# Patient Record
Sex: Male | Born: 1940 | Race: White | Hispanic: No | Marital: Married | Smoking: Never smoker
Health system: Southern US, Community
[De-identification: ages and names within clinical notes are randomized; demographics above are authoritative.]

## PROBLEM LIST (undated history)

## (undated) DIAGNOSIS — I10 Essential (primary) hypertension: Secondary | ICD-10-CM

## (undated) DIAGNOSIS — E119 Type 2 diabetes mellitus without complications: Secondary | ICD-10-CM

## (undated) DIAGNOSIS — I639 Cerebral infarction, unspecified: Secondary | ICD-10-CM

## (undated) DIAGNOSIS — J449 Chronic obstructive pulmonary disease, unspecified: Secondary | ICD-10-CM

---

## 2015-09-08 ENCOUNTER — Encounter (HOSPITAL_BASED_OUTPATIENT_CLINIC_OR_DEPARTMENT_OTHER): Payer: Self-pay | Admitting: Emergency Medicine

## 2015-09-08 ENCOUNTER — Emergency Department (HOSPITAL_BASED_OUTPATIENT_CLINIC_OR_DEPARTMENT_OTHER)
Admission: EM | Admit: 2015-09-08 | Discharge: 2015-09-08 | Disposition: A | Discharge status: Home / Self Care | Payer: Medicare Other | Attending: Emergency Medicine | Admitting: Emergency Medicine

## 2015-09-08 DIAGNOSIS — R231 Pallor: Secondary | ICD-10-CM | POA: Insufficient documentation

## 2015-09-08 DIAGNOSIS — N289 Disorder of kidney and ureter, unspecified: Secondary | ICD-10-CM | POA: Insufficient documentation

## 2015-09-08 DIAGNOSIS — J449 Chronic obstructive pulmonary disease, unspecified: Secondary | ICD-10-CM | POA: Diagnosis not present

## 2015-09-08 DIAGNOSIS — Z794 Long term (current) use of insulin: Secondary | ICD-10-CM | POA: Insufficient documentation

## 2015-09-08 DIAGNOSIS — Z7902 Long term (current) use of antithrombotics/antiplatelets: Secondary | ICD-10-CM | POA: Diagnosis not present

## 2015-09-08 DIAGNOSIS — I10 Essential (primary) hypertension: Secondary | ICD-10-CM | POA: Diagnosis not present

## 2015-09-08 DIAGNOSIS — Z7951 Long term (current) use of inhaled steroids: Secondary | ICD-10-CM | POA: Insufficient documentation

## 2015-09-08 DIAGNOSIS — Z8673 Personal history of transient ischemic attack (TIA), and cerebral infarction without residual deficits: Secondary | ICD-10-CM | POA: Diagnosis not present

## 2015-09-08 DIAGNOSIS — Z79899 Other long term (current) drug therapy: Secondary | ICD-10-CM | POA: Insufficient documentation

## 2015-09-08 DIAGNOSIS — E1165 Type 2 diabetes mellitus with hyperglycemia: Secondary | ICD-10-CM | POA: Diagnosis present

## 2015-09-08 HISTORY — DX: Type 2 diabetes mellitus without complications: E11.9

## 2015-09-08 HISTORY — DX: Cerebral infarction, unspecified: I63.9

## 2015-09-08 HISTORY — DX: Chronic obstructive pulmonary disease, unspecified: J44.9

## 2015-09-08 HISTORY — DX: Essential (primary) hypertension: I10

## 2015-09-08 LAB — CBC
HCT: 38.7 % — ABNORMAL LOW (ref 39.0–52.0)
Hemoglobin: 12.5 g/dL — ABNORMAL LOW (ref 13.0–17.0)
MCH: 28.7 pg (ref 26.0–34.0)
MCHC: 32.3 g/dL (ref 30.0–36.0)
MCV: 88.8 fL (ref 78.0–100.0)
PLATELETS: 317 10*3/uL (ref 150–400)
RBC: 4.36 MIL/uL (ref 4.22–5.81)
RDW: 14 % (ref 11.5–15.5)
WBC: 7.4 10*3/uL (ref 4.0–10.5)

## 2015-09-08 LAB — URINALYSIS, ROUTINE W REFLEX MICROSCOPIC
Bilirubin Urine: NEGATIVE
KETONES UR: 40 mg/dL — AB
LEUKOCYTES UA: NEGATIVE
NITRITE: NEGATIVE
PROTEIN: NEGATIVE mg/dL
Specific Gravity, Urine: 1.017 (ref 1.005–1.030)
Urobilinogen, UA: 0.2 mg/dL (ref 0.0–1.0)
pH: 5 (ref 5.0–8.0)

## 2015-09-08 LAB — PROTIME-INR
INR: 0.96 (ref 0.00–1.49)
Prothrombin Time: 13 seconds (ref 11.6–15.2)

## 2015-09-08 LAB — CBG MONITORING, ED
GLUCOSE-CAPILLARY: 139 mg/dL — AB (ref 65–99)
GLUCOSE-CAPILLARY: 352 mg/dL — AB (ref 65–99)
Glucose-Capillary: 522 mg/dL — ABNORMAL HIGH (ref 65–99)
Glucose-Capillary: 332 mg/dL — ABNORMAL HIGH (ref 65–99)
Glucose-Capillary: 215 mg/dL — ABNORMAL HIGH (ref 65–99)
Glucose-Capillary: 172 mg/dL — ABNORMAL HIGH (ref 65–99)

## 2015-09-08 LAB — BASIC METABOLIC PANEL
ANION GAP: 7 (ref 5–15)
Anion gap: 15 (ref 5–15)
BUN: 52 mg/dL — ABNORMAL HIGH (ref 6–20)
BUN: 45 mg/dL — ABNORMAL HIGH (ref 6–20)
CALCIUM: 9.5 mg/dL (ref 8.9–10.3)
CHLORIDE: 100 mmol/L — AB (ref 101–111)
CO2: 20 mmol/L — ABNORMAL LOW (ref 22–32)
CO2: 24 mmol/L (ref 22–32)
CREATININE: 2.63 mg/dL — AB (ref 0.61–1.24)
Calcium: 8.8 mg/dL — ABNORMAL LOW (ref 8.9–10.3)
Chloride: 93 mmol/L — ABNORMAL LOW (ref 101–111)
Creatinine, Ser: 2.08 mg/dL — ABNORMAL HIGH (ref 0.61–1.24)
GFR calc non Af Amer: 22 mL/min — ABNORMAL LOW (ref >60)
GFR calc non Af Amer: 29 mL/min — ABNORMAL LOW (ref >60)
GFR, EST AFRICAN AMERICAN: 26 mL/min — AB (ref >60)
GFR, EST AFRICAN AMERICAN: 34 mL/min — AB (ref >60)
Glucose, Bld: 480 mg/dL — ABNORMAL HIGH (ref 65–99)
Glucose, Bld: 165 mg/dL — ABNORMAL HIGH (ref 65–99)
Potassium: 4 mmol/L (ref 3.5–5.1)
Potassium: 3.7 mmol/L (ref 3.5–5.1)
SODIUM: 128 mmol/L — AB (ref 135–145)
Sodium: 131 mmol/L — ABNORMAL LOW (ref 135–145)

## 2015-09-08 LAB — URINE MICROSCOPIC-ADD ON

## 2015-09-08 MED ORDER — SODIUM CHLORIDE 0.9 % IV SOLN
INTRAVENOUS | Status: DC
  Administered 2015-09-08: 1.6 [IU]/h via INTRAVENOUS

## 2015-09-08 MED ORDER — SODIUM CHLORIDE 0.9 % IV BOLUS (SEPSIS)
500.0000 mL | Freq: Once | INTRAVENOUS | Status: AC
  Administered 2015-09-08: 500 mL via INTRAVENOUS

## 2015-09-08 MED ORDER — DEXTROSE 50 % IV SOLN: 25.0000 mL | INTRAVENOUS | Status: DC | PRN

## 2015-09-08 MED ORDER — DEXTROSE-NACL 5-0.45 % IV SOLN
INTRAVENOUS | Status: DC
  Administered 2015-09-08: 22:00:00 via INTRAVENOUS

## 2015-09-08 MED ORDER — INSULIN REGULAR BOLUS VIA INFUSION
0.0000 [IU] | Freq: Three times a day (TID) | INTRAVENOUS | Status: DC
  Filled 2015-09-08: qty 10

## 2015-09-08 MED ORDER — SODIUM CHLORIDE 0.9 % IV SOLN
INTRAVENOUS | Status: DC
  Administered 2015-09-08: 21:00:00 via INTRAVENOUS

## 2015-09-08 NOTE — ED Notes (Signed)
Pt noted to have a sm skin tear on left hand, pt states hit left hand on car door after arriving to ED, tegaderm applied.

## 2015-09-08 NOTE — ED Notes (Signed)
Pt alert, NAD, calm, interactive, resps e/u, speaking in clear complete sentences, skin W&D, (denies: pain or other sx), reports dry mouth. Attempting urine sample[facility] Given PO water to drink. Labs resulted and reviewed. IV attempted previously x4, unsuccessfully.

## 2015-09-08 NOTE — ED Notes (Signed)
Pt very alert and oriented, follows commands, denies any pain, dizziness, nausea. Appears in no distress

## 2015-09-08 NOTE — ED Notes (Signed)
[facility] and family about d/c plan and results.

## 2015-09-08 NOTE — ED Notes (Signed)
Pt arrives to ED with c/o elevated blood glucose levels

## 2015-09-08 NOTE — ED Notes (Signed)
cbg checked at time of d/c; cbg = 139

## 2015-09-08 NOTE — ED Notes (Signed)
IV attempts x2 unsuccessful.  

## 2015-09-08 NOTE — ED Notes (Signed)
Blood sugar > 600 for 2 days, states he bolused himself with additional insulin, he has a pump

## 2015-09-08 NOTE — ED Provider Notes (Signed)
CSN: [id number]     Arrival date & time 09/08/15  1828 History  By signing my name below, I, [name], attest that this documentation has been prepared under the direction and in the presence of Arby BarretteMarcy Zhoe Catania, MD. Electronically Signed: [name], ED Scribe. 09/08/2015. 11:32 PM.      Chief Complaint  Patient presents with  . Hyperglycemia    Patient is a 74 y.o. male presenting with hyperglycemia. The history is provided by the patient. No language interpreter was used.  Hyperglycemia  HPI Comments: Francisco Meyer is a 74 y.o. male who presents to the Emergency Department complaining of hyperglycemia onset 1 day prior. Pt states that he has only felt intermittently nauseous since yesterday. Pt states that his highest blood sugar reading was in the 600 range today. Pt states that he gave himself 5 units of insulin and the blood sugar stayed in the 600 range. Pt also reports cramping in his bilateral hands yesterday that resolved after running warm water over his hands. Pt has a an insulin pump in his right abdomen. He states that he is concerned that his insulin pump may be malfunctioning. Pt denies abdominal pain, vomiting, CP or SOB   Past Medical History  Diagnosis Date  . Diabetes mellitus without complication (HCC)   . Stroke (HCC)   . Hypertension   . COPD (chronic obstructive pulmonary disease) (HCC)    History reviewed. No pertinent past surgical history. History reviewed. No pertinent family history. Social History  Substance Use Topics  . Smoking status: Never Smoker   . Smokeless tobacco: None  . Alcohol Use: No    Review of Systems 10 Systems reviewed and all are negative for acute change except as noted in the HPI.    Allergies  Review of patient's allergies indicates no known allergies.  Home Medications   Prior to Admission medications   Medication Sig Start Date End Date Taking? Authorizing Provider  calcium-vitamin D (OSCAL WITH D) 500-200  MG-UNIT tablet Take 1 tablet by mouth.   Yes Historical Provider, MD  carvedilol (COREG CR) 80 MG 24 hr capsule Take 125 mg by mouth daily.   Yes Historical Provider, MD  clopidogrel (PLAVIX) 75 MG tablet Take 75 mg by mouth daily.   Yes Historical Provider, MD  ferrous fumarate (HEMOCYTE - 106 MG FE) 325 (106 FE) MG TABS tablet Take 1 tablet by mouth.   Yes Historical Provider, MD  fexofenadine (ALLEGRA) 30 MG tablet Take 30 mg by mouth 2 (two) times daily.   Yes Historical Provider, MD  Fluticasone-Salmeterol (ADVAIR) 250-50 MCG/DOSE AEPB Inhale 1 puff into the lungs 2 (two) times daily.   Yes Historical Provider, MD  hydrocortisone (CORTEF) 5 MG tablet Take 5 mg by mouth daily.   Yes Historical Provider, MD  insulin aspart (NOVOLOG) 100 UNIT/ML injection Inject into the skin 3 (three) times daily before meals.   Yes Historical Provider, MD  levothyroxine (SYNTHROID, LEVOTHROID) 25 MCG tablet Take 25 mcg by mouth daily before breakfast.   Yes Historical Provider, MD  omeprazole (PRILOSEC) 20 MG capsule Take 20 mg by mouth daily.   Yes Historical Provider, MD  potassium citrate (UROCIT-K) 10 MEQ (1080 MG) SR tablet Take 10 mEq by mouth 3 (three) times daily with meals.   Yes Historical Provider, MD  sertraline (ZOLOFT) 50 MG tablet Take 50 mg by mouth daily.   Yes Historical Provider, MD  simvastatin (ZOCOR) 5 MG tablet Take 5 mg by mouth daily.  Yes Historical Provider, MD  tiotropium (SPIRIVA) 18 MCG inhalation capsule Place 18 mcg into inhaler and inhale daily.   Yes Historical Provider, MD   BP 116/56 mmHg  Pulse 69  Temp(Src) 98.1 F (36.7 C) (Oral)  Resp 20  Ht  (1.778 m)  Wt 160 lb (72.576 kg)  BMI 22.96 kg/m2  SpO2 98%   Physical Exam  Constitutional: He is oriented to person, place, and time. He appears well-developed and well-nourished.  HENT:  Head: Normocephalic and atraumatic.  Mouth/Throat: Oropharynx is clear and moist.  Eyes: EOM are normal. Pupils are equal,  round, and reactive to light.  Neck: Neck supple.  Cardiovascular: Normal rate, regular rhythm, normal heart sounds and intact distal pulses.   Pulmonary/Chest: Effort normal and breath sounds normal.  Abdominal: Soft. Bowel sounds are normal. He exhibits no distension. There is no tenderness.  Musculoskeletal: Normal range of motion. He exhibits no edema or tenderness.  Feet are good condition without any wounds or cellulitis. No peripheral edema.  Neurological: He is alert and oriented to person, place, and time. He has normal strength. Coordination normal. GCS eye subscore is 4. GCS verbal subscore is 5. GCS motor subscore is 6.  Skin: Skin is warm, dry and intact. There is pallor.  Psychiatric: He has a normal mood and affect.    ED Course  Procedures (including critical care time) DIAGNOSTIC STUDIES: Oxygen Saturation is 96% on RA, normal by my interpretation.    COORDINATION OF CARE: 8:20 PM-Discussed treatment plan with pt at bedside and pt agreed to plan.     Labs Review Labs Reviewed  BASIC METABOLIC PANEL - Abnormal; Notable for the following:    Sodium 128 (*)    Chloride 93 (*)    CO2 20 (*)    Glucose, Bld 480 (*)    BUN 52 (*)    Creatinine, Ser 2.63 (*)    GFR calc non Af Amer 22 (*)    GFR calc Af Amer 26 (*)    All other components within normal limits  CBC - Abnormal; Notable for the following:    Hemoglobin 12.5 (*)    HCT 38.7 (*)    All other components within normal limits  URINALYSIS, ROUTINE W REFLEX MICROSCOPIC (NOT AT [facility]) - Abnormal; Notable for the following:    Glucose, UA >1000 (*)    Hgb urine dipstick TRACE (*)    Ketones, ur 40 (*)    All other components within normal limits  BASIC METABOLIC PANEL - Abnormal; Notable for the following:    Sodium 131 (*)    Chloride 100 (*)    Glucose, Bld 165 (*)    BUN 45 (*)    Creatinine, Ser 2.08 (*)    Calcium 8.8 (*)    GFR calc non Af Amer 29 (*)    GFR calc Af Amer 34 (*)    All other  components within normal limits  CBG MONITORING, ED - Abnormal; Notable for the following:    Glucose-Capillary 522 (*)    All other components within normal limits  CBG MONITORING, ED - Abnormal; Notable for the following:    Glucose-Capillary 352 (*)    All other components within normal limits  CBG MONITORING, ED - Abnormal; Notable for the following:    Glucose-Capillary 332 (*)    All other components within normal limits  CBG MONITORING, ED - Abnormal; Notable for the following:    Glucose-Capillary 215 (*)  All other components within normal limits  CBG MONITORING, ED - Abnormal; Notable for the following:    Glucose-Capillary 172 (*)    All other components within normal limits  PROTIME-INR  URINE MICROSCOPIC-ADD ON    Imaging Review No results found.    EKG Interpretation None      MDM   Final diagnoses:  Type 2 diabetes mellitus with hyperglycemia, with long-term current use of insulin (HCC)  Renal insufficiency   It appears that the patient's insulin pump had stopped delivering insulin midday. He reported feeling fatigued and malaise. He did not have fever or other localizing indication of infection. The patient had mild renal insufficiency in addition to hyperglycemia. He was hydrated and started on insulin drip. With hydration and treatment with insulin drip, his blood sugar normalized to 139. Blood pressures and heart rate remain stable. At this time the patient will be discharged to return home and will reload his insulin pump and resumed its use. He is advised if he has problems controlling his blood sugar was still pump is delivering insulin again or if he develops other medical problems he is to return to the emergency department. He is advised to call on the following working day to an endocrinologist to establish follow-up in this area as he has just moved in the past 2 days.       Arby Barrette, MD 09/12/15 [phone]

## 2015-09-08 NOTE — Discharge Instructions (Signed)

## 2015-09-08 NOTE — ED Notes (Signed)
IV access unsuccessful  °

## 2015-09-08 NOTE — ED Notes (Signed)
Reviewed Insulin Pump, insertion site at RLQ of abdomen Basal Hx: 1200A - .775u/hr                  0700A - .975u/hr                  0800A - 1.00u/hr

## 2015-09-08 NOTE — ED Notes (Signed)
Blood work obtained immediately and to lab, orders per protocol

## 2015-09-08 NOTE — ED Notes (Signed)
Also reviewing insulin pump: reading RESIDUAL LOW - pt states his device is basically empty

## 2015-09-08 NOTE — ED Notes (Signed)
[facility]

## 2018-02-19 ENCOUNTER — Inpatient Hospital Stay (HOSPITAL_COMMUNITY): Payer: Medicare Other

## 2018-02-19 ENCOUNTER — Inpatient Hospital Stay (HOSPITAL_COMMUNITY)
Admission: EM | Admit: 2018-02-19 | Discharge: 2018-03-02 | DRG: 091 | Disposition: E | Payer: Medicare Other | Attending: Pulmonary Disease | Admitting: Pulmonary Disease

## 2018-02-19 ENCOUNTER — Emergency Department (HOSPITAL_COMMUNITY): Payer: Medicare Other

## 2018-02-19 DIAGNOSIS — E232 Diabetes insipidus: Secondary | ICD-10-CM | POA: Diagnosis present

## 2018-02-19 DIAGNOSIS — R579 Shock, unspecified: Secondary | ICD-10-CM | POA: Diagnosis present

## 2018-02-19 DIAGNOSIS — Z978 Presence of other specified devices: Secondary | ICD-10-CM | POA: Diagnosis not present

## 2018-02-19 DIAGNOSIS — N289 Disorder of kidney and ureter, unspecified: Secondary | ICD-10-CM | POA: Diagnosis present

## 2018-02-19 DIAGNOSIS — E1129 Type 2 diabetes mellitus with other diabetic kidney complication: Secondary | ICD-10-CM | POA: Diagnosis present

## 2018-02-19 DIAGNOSIS — E872 Acidosis, unspecified: Secondary | ICD-10-CM | POA: Diagnosis present

## 2018-02-19 DIAGNOSIS — J9601 Acute respiratory failure with hypoxia: Secondary | ICD-10-CM | POA: Diagnosis present

## 2018-02-19 DIAGNOSIS — Z515 Encounter for palliative care: Secondary | ICD-10-CM | POA: Diagnosis present

## 2018-02-19 DIAGNOSIS — G9389 Other specified disorders of brain: Secondary | ICD-10-CM | POA: Diagnosis present

## 2018-02-19 DIAGNOSIS — G931 Anoxic brain damage, not elsewhere classified: Principal | ICD-10-CM | POA: Diagnosis present

## 2018-02-19 DIAGNOSIS — Z794 Long term (current) use of insulin: Secondary | ICD-10-CM | POA: Diagnosis not present

## 2018-02-19 DIAGNOSIS — Z8673 Personal history of transient ischemic attack (TIA), and cerebral infarction without residual deficits: Secondary | ICD-10-CM

## 2018-02-19 DIAGNOSIS — E230 Hypopituitarism: Secondary | ICD-10-CM | POA: Diagnosis present

## 2018-02-19 DIAGNOSIS — N390 Urinary tract infection, site not specified: Secondary | ICD-10-CM | POA: Diagnosis present

## 2018-02-19 DIAGNOSIS — R809 Proteinuria, unspecified: Secondary | ICD-10-CM | POA: Diagnosis not present

## 2018-02-19 DIAGNOSIS — J9600 Acute respiratory failure, unspecified whether with hypoxia or hypercapnia: Secondary | ICD-10-CM | POA: Diagnosis not present

## 2018-02-19 DIAGNOSIS — I10 Essential (primary) hypertension: Secondary | ICD-10-CM | POA: Diagnosis present

## 2018-02-19 DIAGNOSIS — I69154 Hemiplegia and hemiparesis following nontraumatic intracerebral hemorrhage affecting left non-dominant side: Secondary | ICD-10-CM

## 2018-02-19 DIAGNOSIS — D649 Anemia, unspecified: Secondary | ICD-10-CM | POA: Diagnosis present

## 2018-02-19 DIAGNOSIS — J449 Chronic obstructive pulmonary disease, unspecified: Secondary | ICD-10-CM | POA: Diagnosis present

## 2018-02-19 DIAGNOSIS — G40901 Epilepsy, unspecified, not intractable, with status epilepticus: Secondary | ICD-10-CM | POA: Diagnosis present

## 2018-02-19 DIAGNOSIS — E039 Hypothyroidism, unspecified: Secondary | ICD-10-CM | POA: Diagnosis present

## 2018-02-19 DIAGNOSIS — J690 Pneumonitis due to inhalation of food and vomit: Secondary | ICD-10-CM | POA: Diagnosis present

## 2018-02-19 DIAGNOSIS — R296 Repeated falls: Secondary | ICD-10-CM | POA: Diagnosis present

## 2018-02-19 DIAGNOSIS — Z66 Do not resuscitate: Secondary | ICD-10-CM | POA: Diagnosis present

## 2018-02-19 DIAGNOSIS — I469 Cardiac arrest, cause unspecified: Secondary | ICD-10-CM | POA: Diagnosis present

## 2018-02-19 DIAGNOSIS — Z79890 Hormone replacement therapy (postmenopausal): Secondary | ICD-10-CM | POA: Diagnosis not present

## 2018-02-19 DIAGNOSIS — I959 Hypotension, unspecified: Secondary | ICD-10-CM

## 2018-02-19 DIAGNOSIS — J984 Other disorders of lung: Secondary | ICD-10-CM

## 2018-02-19 DIAGNOSIS — R8281 Pyuria: Secondary | ICD-10-CM | POA: Diagnosis present

## 2018-02-19 DIAGNOSIS — Z4659 Encounter for fitting and adjustment of other gastrointestinal appliance and device: Secondary | ICD-10-CM

## 2018-02-19 LAB — BLOOD GAS, ARTERIAL
ACID-BASE DEFICIT: 8.8 mmol/L — AB (ref 0.0–2.0)
ACID-BASE DEFICIT: 5.1 mmol/L — AB (ref 0.0–2.0)
Acid-base deficit: 4.1 mmol/L — ABNORMAL HIGH (ref 0.0–2.0)
BICARBONATE: 17 mmol/L — AB (ref 20.0–28.0)
Bicarbonate: 23.7 mmol/L (ref 20.0–28.0)
Bicarbonate: 20.4 mmol/L (ref 20.0–28.0)
DRAWN BY: 41875
Drawn by: 398991
Drawn by: 24686
FIO2: 100
FIO2: 60
FIO2: 60
MECHVT: 590 mL
MECHVT: 590 mL
O2 SAT: 97.3 %
O2 Saturation: 98.6 %
O2 Saturation: 97.8 %
PATIENT TEMPERATURE: 98.6
PATIENT TEMPERATURE: 98.6
PCO2 ART: 39.1 mmHg (ref 32.0–48.0)
PEEP/CPAP: 5 cmH2O
PEEP/CPAP: 5 cmH2O
PH ART: 7.26 — AB (ref 7.350–7.450)
PO2 ART: 391 mmHg — AB (ref 83.0–108.0)
PO2 ART: 126 mmHg — AB (ref 83.0–108.0)
PO2 ART: 134 mmHg — AB (ref 83.0–108.0)
Patient temperature: 98.6
RATE: 20 resp/min
RATE: 20 resp/min
pCO2 arterial: 70.9 mmHg (ref 32.0–48.0)
pCO2 arterial: 44.7 mmHg (ref 32.0–48.0)
pH, Arterial: 7.15 — CL (ref 7.350–7.450)
pH, Arterial: 7.283 — ABNORMAL LOW (ref 7.350–7.450)

## 2018-02-19 LAB — CBC WITH DIFFERENTIAL/PLATELET
BASOS ABS: 0 10*3/uL (ref 0.0–0.1)
Basophils Relative: 0 %
Eosinophils Absolute: 0.1 10*3/uL (ref 0.0–0.7)
Eosinophils Relative: 1 %
HCT: 31 % — ABNORMAL LOW (ref 39.0–52.0)
Hemoglobin: 9.7 g/dL — ABNORMAL LOW (ref 13.0–17.0)
LYMPHS ABS: 1.5 10*3/uL (ref 0.7–4.0)
LYMPHS PCT: 18 %
MCH: 29 pg (ref 26.0–34.0)
MCHC: 31.3 g/dL (ref 30.0–36.0)
MCV: 92.8 fL (ref 78.0–100.0)
MONO ABS: 0.4 10*3/uL (ref 0.1–1.0)
Monocytes Relative: 5 %
NEUTROS ABS: 6.4 10*3/uL (ref 1.7–7.7)
Neutrophils Relative %: 76 %
Platelets: 284 10*3/uL (ref 150–400)
RBC: 3.34 MIL/uL — AB (ref 4.22–5.81)
RDW: 15.4 % (ref 11.5–15.5)
WBC: 8.4 10*3/uL (ref 4.0–10.5)

## 2018-02-19 LAB — URINALYSIS, ROUTINE W REFLEX MICROSCOPIC
BILIRUBIN URINE: NEGATIVE
GLUCOSE, UA: 150 mg/dL — AB
KETONES UR: NEGATIVE mg/dL
NITRITE: NEGATIVE
PH: 5 (ref 5.0–8.0)
Protein, ur: 30 mg/dL — AB
Specific Gravity, Urine: 1.009 (ref 1.005–1.030)

## 2018-02-19 LAB — I-STAT CG4 LACTIC ACID, ED: Lactic Acid, Venous: 6.79 mmol/L (ref 0.5–1.9)

## 2018-02-19 LAB — CBG MONITORING, ED
Glucose-Capillary: 222 mg/dL — ABNORMAL HIGH (ref 65–99)
Glucose-Capillary: 246 mg/dL — ABNORMAL HIGH (ref 65–99)

## 2018-02-19 LAB — GLUCOSE, CAPILLARY
GLUCOSE-CAPILLARY: 84 mg/dL (ref 65–99)
GLUCOSE-CAPILLARY: 76 mg/dL (ref 65–99)
Glucose-Capillary: 230 mg/dL — ABNORMAL HIGH (ref 65–99)

## 2018-02-19 LAB — COMPREHENSIVE METABOLIC PANEL
ALT: 54 U/L (ref 17–63)
AST: 97 U/L — AB (ref 15–41)
Albumin: 2.8 g/dL — ABNORMAL LOW (ref 3.5–5.0)
Alkaline Phosphatase: 88 U/L (ref 38–126)
Anion gap: 18 — ABNORMAL HIGH (ref 5–15)
BUN: 28 mg/dL — AB (ref 6–20)
CO2: 15 mmol/L — ABNORMAL LOW (ref 22–32)
CREATININE: 2.07 mg/dL — AB (ref 0.61–1.24)
Calcium: 8.4 mg/dL — ABNORMAL LOW (ref 8.9–10.3)
Chloride: 102 mmol/L (ref 101–111)
GFR, EST AFRICAN AMERICAN: 34 mL/min — AB (ref >60)
GFR, EST NON AFRICAN AMERICAN: 29 mL/min — AB (ref >60)
Glucose, Bld: 278 mg/dL — ABNORMAL HIGH (ref 65–99)
Potassium: 4.6 mmol/L (ref 3.5–5.1)
Sodium: 135 mmol/L (ref 135–145)
Total Bilirubin: 0.7 mg/dL (ref 0.3–1.2)
Total Protein: 6 g/dL — ABNORMAL LOW (ref 6.5–8.1)

## 2018-02-19 LAB — CBC
HCT: 32.2 % — ABNORMAL LOW (ref 39.0–52.0)
Hemoglobin: 10.2 g/dL — ABNORMAL LOW (ref 13.0–17.0)
MCH: 29 pg (ref 26.0–34.0)
MCHC: 31.7 g/dL (ref 30.0–36.0)
MCV: 91.5 fL (ref 78.0–100.0)
Platelets: 315 10*3/uL (ref 150–400)
RBC: 3.52 MIL/uL — ABNORMAL LOW (ref 4.22–5.81)
RDW: 15.3 % (ref 11.5–15.5)
WBC: 18 10*3/uL — ABNORMAL HIGH (ref 4.0–10.5)

## 2018-02-19 LAB — I-STAT ARTERIAL BLOOD GAS, ED
Acid-base deficit: 5 mmol/L — ABNORMAL HIGH (ref 0.0–2.0)
BICARBONATE: 22.5 mmol/L (ref 20.0–28.0)
O2 SAT: 100 %
PCO2 ART: 44.6 mmHg (ref 32.0–48.0)
PO2 ART: 453 mmHg — AB (ref 83.0–108.0)
Patient temperature: 34.4
TCO2: 24 mmol/L (ref 22–32)
pH, Arterial: 7.297 — ABNORMAL LOW (ref 7.350–7.450)

## 2018-02-19 LAB — LACTIC ACID, PLASMA
Lactic Acid, Venous: 2 mmol/L (ref 0.5–1.9)
Lactic Acid, Venous: 5.5 mmol/L (ref 0.5–1.9)

## 2018-02-19 LAB — BRAIN NATRIURETIC PEPTIDE: B NATRIURETIC PEPTIDE 5: 125.5 pg/mL — AB (ref 0.0–100.0)

## 2018-02-19 LAB — CREATININE, SERUM
Creatinine, Ser: 1.81 mg/dL — ABNORMAL HIGH (ref 0.61–1.24)
GFR calc Af Amer: 40 mL/min — ABNORMAL LOW (ref >60)
GFR, EST NON AFRICAN AMERICAN: 34 mL/min — AB (ref >60)

## 2018-02-19 LAB — HEMOGLOBIN A1C
Hgb A1c MFr Bld: 9.7 % — ABNORMAL HIGH (ref 4.8–5.6)
MEAN PLASMA GLUCOSE: 231.69 mg/dL

## 2018-02-19 LAB — CORTISOL: Cortisol, Plasma: 4 ug/dL

## 2018-02-19 LAB — I-STAT TROPONIN, ED: TROPONIN I, POC: 0.02 ng/mL (ref 0.00–0.08)

## 2018-02-19 LAB — MRSA PCR SCREENING: MRSA BY PCR: NEGATIVE

## 2018-02-19 MED ORDER — SODIUM BICARBONATE 8.4 % IV SOLN
50.0000 meq | Freq: Once | INTRAVENOUS | Status: AC
  Administered 2018-02-19: 50 meq via INTRAVENOUS
  Filled 2018-02-19: qty 50

## 2018-02-19 MED ORDER — STERILE WATER FOR INJECTION IV SOLN
INTRAVENOUS | Status: DC
  Administered 2018-02-19 – 2018-02-20 (×3): via INTRAVENOUS
  Filled 2018-02-19 (×4): qty 850

## 2018-02-19 MED ORDER — HEPARIN SODIUM (PORCINE) 5000 UNIT/ML IJ SOLN
5000.0000 [IU] | Freq: Three times a day (TID) | INTRAMUSCULAR | Status: DC
  Administered 2018-02-19 – 2018-02-21 (×6): 5000 [IU] via SUBCUTANEOUS
  Filled 2018-02-19 (×6): qty 1

## 2018-02-19 MED ORDER — LORAZEPAM 2 MG/ML IJ SOLN
INTRAMUSCULAR | Status: AC
  Administered 2018-02-19: 2 mg
  Filled 2018-02-19: qty 1

## 2018-02-19 MED ORDER — HYDROCORTISONE NA SUCCINATE PF 100 MG IJ SOLR
100.0000 mg | Freq: Three times a day (TID) | INTRAMUSCULAR | Status: DC
  Administered 2018-02-19 – 2018-02-21 (×7): 100 mg via INTRAVENOUS
  Filled 2018-02-19 (×7): qty 2

## 2018-02-19 MED ORDER — PANTOPRAZOLE SODIUM 40 MG PO PACK
40.0000 mg | PACK | Freq: Every day | ORAL | Status: DC
  Administered 2018-02-20 – 2018-02-21 (×2): 40 mg
  Filled 2018-02-19 (×3): qty 20

## 2018-02-19 MED ORDER — ALBUTEROL SULFATE (2.5 MG/3ML) 0.083% IN NEBU
INHALATION_SOLUTION | RESPIRATORY_TRACT | Status: AC
  Filled 2018-02-19: qty 3

## 2018-02-19 MED ORDER — VANCOMYCIN HCL 10 G IV SOLR
1500.0000 mg | Freq: Once | INTRAVENOUS | Status: AC
  Administered 2018-02-19: 1500 mg via INTRAVENOUS
  Filled 2018-02-19: qty 1500

## 2018-02-19 MED ORDER — ALBUTEROL SULFATE (2.5 MG/3ML) 0.083% IN NEBU
2.5000 mg | INHALATION_SOLUTION | RESPIRATORY_TRACT | Status: DC
  Administered 2018-02-19 – 2018-02-20 (×5): 2.5 mg via RESPIRATORY_TRACT
  Filled 2018-02-19 (×4): qty 3

## 2018-02-19 MED ORDER — BUDESONIDE 0.25 MG/2ML IN SUSP
0.2500 mg | Freq: Two times a day (BID) | RESPIRATORY_TRACT | Status: DC
  Administered 2018-02-19 – 2018-02-21 (×4): 0.25 mg via RESPIRATORY_TRACT
  Filled 2018-02-19 (×4): qty 2

## 2018-02-19 MED ORDER — ALBUTEROL SULFATE (2.5 MG/3ML) 0.083% IN NEBU
2.5000 mg | INHALATION_SOLUTION | RESPIRATORY_TRACT | Status: DC | PRN
Start: 2018-02-19 — End: 2018-02-22
  Administered 2018-02-19: 2.5 mg via RESPIRATORY_TRACT

## 2018-02-19 MED ORDER — ORAL CARE MOUTH RINSE
15.0000 mL | Freq: Two times a day (BID) | OROMUCOSAL | Status: DC
Start: 2018-02-20 — End: 2018-02-19

## 2018-02-19 MED ORDER — CHLORHEXIDINE GLUCONATE 0.12 % MT SOLN: 15.0000 mL | Freq: Two times a day (BID) | OROMUCOSAL | Status: DC

## 2018-02-19 MED ORDER — LORAZEPAM 2 MG/ML IJ SOLN
1.0000 mg | INTRAMUSCULAR | Status: DC | PRN
  Administered 2018-02-19: 1 mg via INTRAVENOUS
  Filled 2018-02-19: qty 1

## 2018-02-19 MED ORDER — SODIUM CHLORIDE 0.9 % IV SOLN: 250.0000 mL | INTRAVENOUS | Status: DC | PRN

## 2018-02-19 MED ORDER — SODIUM CHLORIDE 0.9 % IV SOLN
INTRAVENOUS | Status: DC
  Administered 2018-02-19 (×2): via INTRAVENOUS

## 2018-02-19 MED ORDER — SODIUM CHLORIDE 0.9 % IV SOLN: INTRAVENOUS | Status: DC | PRN

## 2018-02-19 MED ORDER — NOREPINEPHRINE BITARTRATE 1 MG/ML IV SOLN
0.0000 ug/min | INTRAVENOUS | Status: DC
  Administered 2018-02-19: 6 ug/min via INTRAVENOUS
  Administered 2018-02-20: 8 ug/min via INTRAVENOUS
  Administered 2018-02-21: 2 ug/min via INTRAVENOUS
  Filled 2018-02-19 (×3): qty 4

## 2018-02-19 MED ORDER — MIDAZOLAM HCL 2 MG/2ML IJ SOLN
2.0000 mg | INTRAMUSCULAR | Status: DC | PRN
  Administered 2018-02-19 – 2018-02-20 (×3): 2 mg via INTRAVENOUS
  Filled 2018-02-19: qty 2

## 2018-02-19 MED ORDER — ALBUTEROL SULFATE (2.5 MG/3ML) 0.083% IN NEBU
2.5000 mg | INHALATION_SOLUTION | Freq: Four times a day (QID) | RESPIRATORY_TRACT | Status: DC
  Administered 2018-02-19: 2.5 mg via RESPIRATORY_TRACT
  Filled 2018-02-19: qty 3

## 2018-02-19 MED ORDER — INSULIN ASPART 100 UNIT/ML ~~LOC~~ SOLN
0.0000 [IU] | SUBCUTANEOUS | Status: DC
  Administered 2018-02-19 (×2): 7 [IU] via SUBCUTANEOUS
  Administered 2018-02-20: 15 [IU] via SUBCUTANEOUS
  Administered 2018-02-20: 7 [IU] via SUBCUTANEOUS
  Administered 2018-02-20: 15 [IU] via SUBCUTANEOUS
  Administered 2018-02-20: 11 [IU] via SUBCUTANEOUS
  Administered 2018-02-21: 4 [IU] via SUBCUTANEOUS
  Administered 2018-02-21: 11 [IU] via SUBCUTANEOUS
  Filled 2018-02-19: qty 1

## 2018-02-19 MED ORDER — VANCOMYCIN HCL IN DEXTROSE 1-5 GM/200ML-% IV SOLN
1000.0000 mg | INTRAVENOUS | Status: DC
  Administered 2018-02-20 – 2018-02-21 (×2): 1000 mg via INTRAVENOUS
  Filled 2018-02-19 (×2): qty 200

## 2018-02-19 MED ORDER — SODIUM CHLORIDE 0.9 % IV SOLN
0.5000 mg/h | INTRAVENOUS | Status: DC
  Administered 2018-02-19: 2 mg/h via INTRAVENOUS
  Filled 2018-02-19 (×2): qty 10
  Filled 2018-02-19: qty 20

## 2018-02-19 MED ORDER — SODIUM CHLORIDE 0.9 % IV BOLUS
2000.0000 mL | Freq: Once | INTRAVENOUS | Status: AC
  Administered 2018-02-19: 2000 mL via INTRAVENOUS

## 2018-02-19 MED ORDER — ORAL CARE MOUTH RINSE
15.0000 mL | OROMUCOSAL | Status: DC
  Administered 2018-02-19 – 2018-02-21 (×17): 15 mL via OROMUCOSAL

## 2018-02-19 MED ORDER — NOREPINEPHRINE BITARTRATE 1 MG/ML IV SOLN
0.0000 ug/min | Freq: Once | INTRAVENOUS | Status: AC
  Administered 2018-02-19: 10 ug/min via INTRAVENOUS
  Filled 2018-02-19: qty 4

## 2018-02-19 MED ORDER — PIPERACILLIN-TAZOBACTAM 3.375 G IVPB 30 MIN
3.3750 g | Freq: Once | INTRAVENOUS | Status: AC
Start: 2018-02-19 — End: 2018-02-19
  Administered 2018-02-19: 3.375 g via INTRAVENOUS
  Filled 2018-02-19: qty 50

## 2018-02-19 MED ORDER — CHLORHEXIDINE GLUCONATE 0.12% ORAL RINSE (MEDLINE KIT)
15.0000 mL | Freq: Two times a day (BID) | OROMUCOSAL | Status: DC
  Administered 2018-02-19 – 2018-02-21 (×4): 15 mL via OROMUCOSAL

## 2018-02-19 MED FILL — Medication: Qty: 1 | Status: AC

## 2018-02-19 NOTE — ED Provider Notes (Signed)
[facility] Note   CSN: [id number] Arrival date & time: 2018/09/20  [id number]     History   Chief Complaint Chief Complaint  Patient presents with  . Trauma   Level 5 caveat: Unresponsive/intubated  HPI Francisco Meyer is a 77 y.o. male.  HPI Patient is a 77 year old male who is brought to the emergency department post CPR.  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  He has a history of COPD and diabetes.  Downtime is known to be at least 15 minutes.  Was in his normal state of health today.  Had been assisted to the restroom to use the commode.  When staff went to retrieve him from the commode they initially had difficulty entering and found him laying on the floor unresponsive.  Initial rhythm for EMS was asystole.  Patient was intubated on scene and standard ACLS was initiated.  He received epinephrine x2 and return to normal sinus rhythm.  He has had hypotension in route.  He has been given 500 cc of fluids.    Past medical history: Hemorrhagic stroke Left-sided hemiparesis Hypothyroidism Diabetes COPD          Home Medications    Unavailable  Family History Unknown  Social History Unable to obtain   Allergies   Patient has no known allergies.   Review of Systems Review of Systems  Unable to perform ROS: Intubated     Physical Exam Updated Vital Signs BP (!) 77/59   Pulse 79   Temp (!) 96 F (35.6 C) (Rectal)   Resp 18   SpO2 97%   Physical Exam   ED Treatments / Results  Labs (all labs ordered are listed, but only abnormal results are displayed) Labs Reviewed  CBC WITH DIFFERENTIAL/PLATELET - Abnormal; Notable for the following components:      Result Value   RBC 3.34 (*)    Hemoglobin 9.7 (*)    HCT 31.0 (*)    All other components within normal limits  I-STAT CG4 LACTIC ACID, ED - Abnormal; Notable for the  following components:   Lactic Acid, Venous 6.79 (*)    All other components within normal limits  CBG MONITORING, ED - Abnormal; Notable for the following components:   Glucose-Capillary 222 (*)    All other components within normal limits  CULTURE, BLOOD (ROUTINE X 2)  CULTURE, BLOOD (ROUTINE X 2)  URINE CULTURE  COMPREHENSIVE METABOLIC PANEL  URINALYSIS, ROUTINE W REFLEX MICROSCOPIC  I-STAT TROPONIN, ED  I-STAT ARTERIAL BLOOD GAS, ED    EKG EKG Interpretation  Date/Time:  Saturday February 19 2018 06:47:35 EDT Ventricular Rate:  87 PR Interval:    QRS Duration: 142 QT Interval:  435 QTC Calculation: 524 R Axis:   -74 Text Interpretation:  Sinus rhythm RBBB and LAFB No old tracing to compare Confirmed by Azalia Bilisampos, Dail Lerew ([id number]) on 06-22-2018 7:16:38 AM   Radiology Dg Chest Portable 1 View  Result Date: 06-22-2018 CLINICAL DATA:  Status post cardiac arrest and CPR.  Intubated. EXAM: PORTABLE CHEST 1 VIEW COMPARISON:  None. FINDINGS: Endotracheal tube tip in the right mainstem bronchus. Possible nasogastric tube extending into the stomach. Normal sized heart. Dense aortic arch calcifications. The interstitial markings are mildly prominent there is some linear density in the right upper lobe and left lung base. Diffuse osteopenia. IMPRESSION: 1. Mild left basilar atelectasis and possible early changes of aspiration pneumonitis.  2. Linear scarring in the right upper lobe. 3. Mild chronic interstitial lung disease. Electronically Signed   By: Beckie Salts M.D.   On: 02/22/2018 07:30    Procedures .Critical Care Performed by: Azalia Bilis, MD Authorized by: Azalia Bilis, MD     CRITICAL CARE Performed by: Azalia Bilis Total critical care time: 40 minutes Critical care time was exclusive of separately billable procedures and treating other patients. Critical care was necessary to treat or prevent imminent or life-threatening deterioration. Critical care was time spent personally  by me on the following activities: development of treatment plan with patient and/or surrogate as well as nursing, discussions with consultants, evaluation of patient's response to treatment, examination of patient, obtaining history from patient or surrogate, ordering and performing treatments and interventions, ordering and review of laboratory studies, ordering and review of radiographic studies, pulse oximetry and re-evaluation of patient's condition.   Medications Ordered in ED Medications  0.9 %  sodium chloride infusion (has no administration in time range)  sodium chloride 0.9 % bolus 2,000 mL (0 mLs Intravenous Stopped 02/06/2018 0725)  norepinephrine (LEVOPHED) 4 mg in dextrose 5 % 250 mL (0.016 mg/mL) infusion (10 mcg/min Intravenous New Bag/Given 02/22/2018 0709)     Initial Impression / Assessment and Plan / ED Course  I have reviewed the triage vital signs and the nursing notes.  Pertinent labs & imaging results that were available during my care of the patient were reviewed by me and considered in my medical decision making (see chart for details).    Initial rhythm for EMS was asystole.  Patient in normal sinus rhythm on arrival to the emergency department.  No clear etiology for cause as he had otherwise been his normal state of health.  Son reports frequent falls.  He was in an outside hospital yesterday for a fall and had a CT scan of his head.  He states the falls are not uncommon for the patient given his history of hemorrhagic stroke.  2 L of IV fluids given and persistent hypotension remains.  Additional 2 L of fluid will be given now and patient will be started on levo fed.  Elevated lactate may be just from downtime.  Will cover for infection in the meantime with vancomycin and Zosyn.  Blood and urine cultures.  Temp Foley.  Respiratory to place left-sided arterial line.  Records from outside hospital reviewed including recent ER visit with CT imaging of the head.  Hopefully  will be able to wean the patient's levophed  Consultations: Pulmonary critical care  Disposition: Intensive care unit   Final Clinical Impressions(s) / ED Diagnoses   Final diagnoses:  Cardiac arrest (HCC)  Hypotension, unspecified hypotension type    ED Discharge Orders    None       Azalia Bilis, MD 02/20/18 [phone]

## 2018-02-19 NOTE — Progress Notes (Signed)
Pt transported on vent from the ED. Upon arrival to the 3rd floor we rolled off the elevator. RN noticed irregular rhythm. RN & I both checked for a pulse & was unable to detect. CPR called by the 3 east staff. CPR started by RN & I started bagging pt with 100% oxygen. Code staff arrived & CPR continued on the way to 3M08. [facility].

## 2018-02-19 NOTE — Progress Notes (Signed)
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  BP marginal --> now on Norepinephrine IV infusion.  eICU Interventions  Will order: 1. D/C Ativan.  2. Versed IV infusion. Titrate to RASS = 0 to -1.  3. Versed 2 mg IV Q 1 hour PRN jerking activity.      Intervention Category Major Interventions: Other:  Sommer,Steven Dennard Nipugene 02/10/2018, 10:40 PM

## 2018-02-19 NOTE — Progress Notes (Signed)
CRITICAL VALUE ALERT  Critical Value:  Lactic Acid 2  Date & Time Notied:  10-17-18 1300  Provider Notified: Dr. Ardeth PerfectJeong  Orders Received/Actions taken: MD aware, no new orders

## 2018-02-19 NOTE — Progress Notes (Signed)
Informed Dr. Ardeth PerfectJeong about pt having increased myoclonic type jerking moment.  Ativan 1mg PRN ordered.  Erick Blinksuchman, Shivani Barrantes D, RN

## 2018-02-19 NOTE — Progress Notes (Addendum)
At approximately 1313 patient was transferring from the ED to room 3M07 by SWOT nurse and respiratory therapy.  He went into asystole in the elevator  outside of 3 east. CPR was started and code system activated. 3 east assisted in the code. Rapid response came and patient was quickly moved to the unit while CPR still in progress. Code efforts were continued.

## 2018-02-19 NOTE — Progress Notes (Signed)
   07/05/18 1800  Clinical Encounter Type  Visited With Patient;Patient and family together  Visit Type Initial  Referral From Nurse  Consult/Referral To Chaplain  Spiritual Encounters  Spiritual Needs Emotional  Stress Factors  Patient Stress Factors Exhausted  Family Stress Factors Exhausted   Pt was being moved from ED to unit 54M but coded while in the elevator. Swot nursestarted CPR. Family members , son and wife had left to bring Code papers from mum. Son arrived and brought the paper. Doctor had conversation with family members. Chaplain provided emotional support to family through compassionate presence.  Vishal Sandlin a Water quality scientistMusiko-Holley, E. [employer]. du PontChaplain

## 2018-02-19 NOTE — Progress Notes (Signed)
eLink Physician-Brief Progress Note Patient Name: Francisco Meyer DOB: 09/29/1941 MRN: [id number]   Date of Service  02/02/2018  HPI/Events of Note  Anoxic Myoclonus.   eICU Interventions  Will order: 1. Increase Ativan to 1 mg IV Q 2 hours PRN jerking movements.      Intervention Category Major Interventions: Other:  [name],[name] 02/24/2018, 9:38 PM

## 2018-02-19 NOTE — ED Triage Notes (Addendum)
[facility] Post CPR. Downtime unknown. 911 call to start of CPR was 15 minutes. Started on [place] and received 2 Epinephrine before return of pulses.

## 2018-02-19 NOTE — Progress Notes (Signed)
RT transported patient to CT and back to ED. Vital signs stable through out.

## 2018-02-19 NOTE — Progress Notes (Signed)
Pharmacy Antibiotic Note  Francisco Meyer is a 77 y.o. male admitted on 03-15-18 s/p CPR.  Pharmacy has been consulted for vancomycin dosing.  Patient received one dose of Zosyn in ED.    SCr 2.07, CrCL 30 ml/min.  Afebrile, WBC WNL, LA 6.79.   Plan: Vanc  IV x 1, then 1gm IV Q24H Monitor renal fxn, clinical progress, vanc trough as indicated F/U with continuation of Gram negative coverage   Height:  (177.8 cm) Weight: 175 lb (79.4 kg) IBW/kg (Calculated) : 73  Temp (24hrs), Avg:95 F (35 C), Min:93.9 F (34.4 C), Max:96 F (35.6 C)  Recent Labs  Lab 03/15/18 0659 2018/03/15 0702  WBC 8.4  --   CREATININE 2.07*  --   LATICACIDVEN  --  6.79*    Estimated Creatinine Clearance: 30.4 mL/min (A) (by C-G formula based on SCr of 2.07 mg/dL (H)).    No Known Allergies   Vanc 4/20 >> Zosyn   4/20 BCx -  4/20 UCx -    Francisco Meyer D. Laney Potash, PharmD, BCPS, BCCCP Pager:  [phone] Mar 15, 2018, 9:04 AM

## 2018-02-19 NOTE — Progress Notes (Signed)
Initial Nutrition Assessment  DOCUMENTATION CODES:   Not applicable  INTERVENTION:    If TF started, rec initiating Vital AF 1.2 at goal rate of 65 ml/h (1560 ml per day)   Provides 1872 kcals, 117 gm protein, 1265 ml free water daily  NUTRITION DIAGNOSIS:   Inadequate oral intake related to inability to eat as evidenced by NPO status  GOAL:   Patient will meet greater than or equal to 90% of their needs  MONITOR:   Vent status, Labs, Skin, Weight trends, I & O's  REASON FOR ASSESSMENT:   Consult (Assessment and TF recommendations)  ASSESSMENT:   77 yo Male who [facility] presenting with anoxic encephalopathy, post-CPR resuscitation.  Patient is currently intubated on ventilator support MV: 14.6 L/min Temp (24hrs), Avg:92.7 F (33.7 C), Min:91.8 F (33.2 C), Max:96 F (35.6 C)  OGT in place  RN speaking with family members upon visit. Did not disturb. Pt fell in bathroom. Unresponsive with asystole and prolonged CPR. Labs and medications reviewed. CBG's 222-246. No nutrition problems identified PTA. Prognosis is dismal.  NUTRITION - FOCUSED PHYSICAL EXAM:  Deferred at this time.  Diet Order:  Diet NPO time specified Except for: Other (See Comments)  EDUCATION NEEDS:   Not appropriate for education at this time  Skin:  Skin Assessment: Skin Integrity Issues: Skin Integrity Issues:: Other (Comment) Other: bilateral knee, forehead laceration  Last BM:  PTA   Intake/Output Summary (Last 24 hours) at 02/28/2018 1549 Last data filed at 02/08/2018 1505 Gross per 24 hour  Intake 231.25 ml  Output 30 ml  Net 201.25 ml   Height:   Ht Readings from Last 1 Encounters:  02/17/2018 5\' 10"  (1.778 m)   Weight:   Wt Readings from Last 1 Encounters:  02/16/2018 175 lb (79.4 kg)   Ideal Body Weight:  75.4 kg  BMI:  Body mass index is 25.11 kg/m.  Estimated Nutritional Needs:   Kcal:  1875  Protein:  110-125 gm  Fluid:   per MD  Maureen ChattersKatie Jeffie Spivack, RD, LDN Pager #: [phone] After-Hours Pager #: [phone]

## 2018-02-19 NOTE — Procedures (Signed)
Arterial Catheter Insertion Procedure Note Terrall Laitydwin M Kuba Sr [id number] 08/13/1941  Procedure: Insertion of Arterial Catheter  Indications: Blood pressure monitoring  Procedure Details Consent: Unable to obtain consent because of emergent medical necessity. Time Out: Verified patient identification, verified procedure, site/side was marked, verified correct patient position, special equipment/implants available, medications/allergies/relevent history reviewed, required imaging and test results available.  Performed  Maximum sterile technique was used including antiseptics, cap, gloves, gown, hand hygiene, mask and sheet. Skin prep: Chlorhexidine; local anesthetic administered 20 gauge catheter was inserted into left radial artery using the Seldinger technique. ULTRASOUND GUIDANCE USED: NO Evaluation Blood flow good; BP tracing good. Complications: No apparent complications. RT placed arrow catheter on first attempt.  Morley KosJohnson, Nashly Olsson Leroy 2018-01-28

## 2018-02-19 NOTE — Progress Notes (Signed)
CRITICAL VALUE ALERT  Critical Value:  Lactic Acid 5.5  Date & Time Notied:  4/20/21019 1510  Provider Notified: Dr. Ardeth PerfectJeong  Orders Received/Actions taken: Push Amp of Bicarb

## 2018-02-19 NOTE — H&P (Addendum)
HISTORY & PHYSICAL  Patient Name: Francisco Meyer MRN: [id number] DOB: 02-13-1941    ADMISSION DATE:  2018-03-14 DATE OF SERVICE:  03-14-2018  CHIEF COMPLAINT:  Anoxic encephalopathy, post-CPR resuscitation   HISTORY OF PRESENT ILLNESS  This 77 y.o. Caucasian male presented to the [facility] Emergency Department from an assisted living facility via EMS with complaints of anoxic encephalopathy.  At the time of clinical interview, the patient is intubated and unresponsive and unable to provide any information.  The patient's son and daughter-in-law are at the bedside.  The patient's son reports that the assisted living staff reported that the patient was awakened around 5 AM.  Facility staff assisted the patient to the bathroom.  Approximately 15 minutes later, the staff went back to check on the patient and went up and were unable able to open the bathroom door because the patient had fallen against it.  When they managed to get in, they found that he was pulseless.  EMS his first assessment was reported to be asystole on the monitor.  Documented downtime suggests at least 15-25 minutes without ROSC (911 call to initiation of CPR was 15 minutes).  CPR was provided with a Samuel Bouche device in addition to 2 amps of epinephrine.  In the emergency department, the patient has been on norepinephrine infusion and intravenous fluid boluses.  Currently, he is in a sinus rhythm and is hypertensive.  The patient has been living in assisted living facility for several years (at least 8 years).  The patient and his wife previously lived in [place].  The patient's son moved his parents up to [place].  Overall, the patient has been in deteriorating health.  He is very unsteady on his gait and requires use of a walker and assistance.  He still experiences frequent falls, as evidenced by multiple bruises, avulsions and lacerations on his body.  REVIEW OF SYSTEMS This patient is critically ill  and cannot provide additional history nor review of systems due to unconsciousness and ventilated.   PAST MEDICAL/SURGICAL/SOCIAL/FAMILY HISTORIES   No past medical history on file.  Per report from the patient's son, patient is a life time non-smoker with a history of stroke, respiratory failure (apparently secondary to some obstructive lung disease, as he is on bronchodilator therapy at home), frequent falls, type 2 diabetes and hypertension.   Social History   Tobacco Use  . Smoking status: Not on file  Substance Use Topics  . Alcohol use: Not on file    No family history on file.   No Known Allergies   Prior to Admission medications   Medication Sig Start Date End Date Taking? Authorizing Provider  carvedilol (COREG) 3.125 MG tablet Take 3.125 mg by mouth 2 (two) times daily with a meal.   Yes [provider]  ferrous sulfate 325 (65 FE) MG tablet Take 325 mg by mouth daily with breakfast.   Yes [provider]  fexofenadine (ALLEGRA) 180 MG tablet Take 180 mg by mouth daily.   Yes [provider]  Fluticasone-Salmeterol (ADVAIR) 250-50 MCG/DOSE AEPB Inhale 1 puff into the lungs 2 (two) times daily.   Yes [provider]  hydrocortisone (CORTEF) 5 MG tablet Take 10 mg by mouth 2 (two) times daily.   Yes [provider]  insulin aspart (NOVOLOG) 100 UNIT/ML injection Inject 4-16 Units into the skin 3 (three) times daily before meals. Sliding scale If 120-160=4 units 161-200=5 units 201-240=6 units 241-280=7 units 281-320=8 units 321-360=9  units 361-400=12 units 401-450=14 units 451-600=16 units If blood sugar is less than 120 skip it   Yes [provider]  insulin glargine (LANTUS) 100 UNIT/ML injection Inject 14 Units into the skin at bedtime.   Yes [provider]  levothyroxine (SYNTHROID, LEVOTHROID) 25 MCG tablet Take 12.5 mcg by mouth daily before breakfast.   Yes [provider]  lidocaine  (LMX) 4 % cream Apply 1 application topically 4 (four) times daily as needed (for pain).   Yes [provider]  magnesium oxide (MAG-OX) 400 MG tablet Take 400 mg by mouth daily.   Yes [provider]  Vitamin D, Ergocalciferol, (DRISDOL) 50000 units CAPS capsule Take 50,000 Units by mouth every 7 (seven) days. On Monday   Yes [provider]    Current Facility-Administered Medications  Medication Dose Route Frequency Provider Last Rate Last Dose  . 0.9 %  sodium chloride infusion   Intra-arterial PRN Azalia Bilis, MD      . Melene Muller ON 02/20/2018] vancomycin (VANCOCIN) IVPB 1000 mg/200 mL premix  1,000 mg Intravenous Q24H Dang, Thuy D, [facility]       Current Outpatient Medications  Medication Sig Dispense Refill  . carvedilol (COREG) 3.125 MG tablet Take 3.125 mg by mouth 2 (two) times daily with a meal.    . ferrous sulfate 325 (65 FE) MG tablet Take 325 mg by mouth daily with breakfast.    . fexofenadine (ALLEGRA) 180 MG tablet Take 180 mg by mouth daily.    . Fluticasone-Salmeterol (ADVAIR) 250-50 MCG/DOSE AEPB Inhale 1 puff into the lungs 2 (two) times daily.    . hydrocortisone (CORTEF) 5 MG tablet Take 10 mg by mouth 2 (two) times daily.    . insulin aspart (NOVOLOG) 100 UNIT/ML injection Inject 4-16 Units into the skin 3 (three) times daily before meals. Sliding scale If 120-160=4 units 161-200=5 units 201-240=6 units 241-280=7 units 281-320=8 units 321-360=9 units 361-400=12 units 401-450=14 units 451-600=16 units If blood sugar is less than 120 skip it    . insulin glargine (LANTUS) 100 UNIT/ML injection Inject 14 Units into the skin at bedtime.    Marland Kitchen levothyroxine (SYNTHROID, LEVOTHROID) 25 MCG tablet Take 12.5 mcg by mouth daily before breakfast.    . lidocaine (LMX) 4 % cream Apply 1 application topically 4 (four) times daily as needed (for pain).    . magnesium oxide (MAG-OX) 400 MG tablet Take 400 mg by mouth daily.    . Vitamin D, Ergocalciferol,  (DRISDOL) 50000 units CAPS capsule Take 50,000 Units by mouth every 7 (seven) days. On Monday       VITAL SIGNS: BP (!) 157/80   Pulse 77   Temp (!) 93.9 F (34.4 C) (Bladder)   Resp (!) 26   Ht 5\' 10"  (1.778 m)   Wt 79.4 kg (175 lb)   SpO2 100%   BMI 25.11 kg/m     VENTILATOR SETTINGS: Vent Mode: PRVC FiO2 (%):  [50 %-100 %] 50 % Set Rate:  [18 bmp] 18 bmp Vt Set:  [550 mL-590 mL] 590 mL PEEP:  [5 cmH20] 5 cmH20 Plateau Pressure:  [15 cmH20-26 cmH20] 26 cmH20  INTAKE / OUTPUT: No intake/output data recorded.  PHYSICAL EXAMINATION: GENERAL: Intubated.  Unresponsive.  Opens eyes to noxious stimuli.  Does not localize to pain.  HEAD: normocephalic, atraumatic EYE: PERRLA, EOM intact, no scleral icterus, no pallor. THROAT/ORAL CAVITY: ETT in situ. Mallampati classification cannot be determined at this time. NECK: supple, no thyromegaly, no JVD,  no lymphadenopathy. Trachea midline. CHEST/LUNG: symmetric in development and expansion. Good air entry.  Rales and rhonchi bilaterally (R > L). HEART: Regular S1 and S2 without murmur, rub or gallop. ABDOMEN: Pendulous.  Soft, nontender, nondistended.  Diminished bowel sounds. No rebound. No guarding. No hepatosplenomegaly. EXTREMITIES: Left intraosseous device in situ.  Edema: none. No cyanosis.  No clubbing. 2+ DP pulses LYMPHATIC: no cervical/axiallary/inguinal lymph nodes appreciated MUSCULOSKELETAL: No bulk atrophy. SKIN: Multiple traumatic lesions: Left supraoptic/frontal avulsion.  Bruises on both forearms.  Excoriation/avulsion bilaterally at the knuckles.  Bilateral patellar avulsion wounds. NEUROLOGIC: No definite doll's eyes.  No corneal reflex.  No cough/gag.  Spontaneous respirations intact. Cranial nerves II-XII are grossly symmetric and physiologic. Babinski absent.  Opens eyes to pain at both upper extremities; however, no response to noxious stimuli in the lower extremities.   LABS:  BASIC METABOLIC PROFILE Recent  Labs  Lab 2018-02-26 0659  NA 135  K 4.6  CL 102  CO2 15*  BUN 28*  CREATININE 2.07*  GLUCOSE 278*  CALCIUM 8.4*    Glucose Recent Labs  Lab 2018-02-26 0736  GLUCAP 222*    Liver Enzymes Recent Labs  Lab 02/26/18 0659  AST 97*  ALT 54  ALKPHOS 88  BILITOT 0.7  ALBUMIN 2.8*    CBC Recent Labs  Lab February 26, 2018 0659  WBC 8.4  HGB 9.7*  HCT 31.0*  PLT 284    COAGULATION STUDIES No results for input(s): APTT, INR in the last 168 hours.  SEPSIS MARKERS Recent Labs  Lab 2018-02-26 0702  LATICACIDVEN 6.79*    ABG Recent Labs  Lab 02/26/2018 0807  PHART 7.297*  PCO2ART 44.6  PO2ART 453.0*    Cardiac Enzymes No results for input(s): TROPONINI, PROBNP in the last 168 hours.  Imaging Ct Head Wo Contrast  Result Date: 02/26/18 CLINICAL DATA:  Found down, unresponsive.  CPR performed. EXAM: CT HEAD WITHOUT CONTRAST CT CERVICAL SPINE WITHOUT CONTRAST TECHNIQUE: Multidetector CT imaging of the head and cervical spine was performed following the standard protocol without intravenous contrast. Multiplanar CT image reconstructions of the cervical spine were also generated. COMPARISON:  02/17/2018 FINDINGS: CT HEAD FINDINGS Brain: Encephalomalacia is again seen in the left greater than right anterior frontal lobes as well as in the right frontal and parietal lobes towards the vertex. There is also a small chronic left occipital infarct. Atrophy with ex vacuo enlargement of the ventricles is unchanged. There is no evidence of acute infarct, intracranial hemorrhage, midline shift, or extra-axial fluid collection. Vascular: Calcified atherosclerosis at the skull base. No hyperdense vessel. Skull: No acute fracture. 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 Sinuses/Orbits: Chronic left sphenoid sinus opacification which is contiguous with the clival erosion. Bilateral cataract extraction. Clear mastoid air  cells. Other: None. CT CERVICAL SPINE FINDINGS Alignment: Unchanged.  No acute traumatic subluxation. Skull base and vertebrae: No acute fracture or destructive osseous process. Soft tissues and spinal canal: No prevertebral fluid or swelling. No visible canal hematoma. Disc levels: Mild-to-moderate cervical disc degeneration, greatest at C5-6 where uncovertebral spurring results in moderate right and mild left neural foraminal stenosis. Uncovertebral spurring and mild left facet arthrosis at C3-4 result in moderate left neural foraminal stenosis. Upper chest: Partially visualized pleuroparenchymal scarring and calcification in the left lung apex with milder changes on the right. Other: Partially visualized endotracheal and enteric tubes. Moderate left carotid artery calcified atherosclerosis. IMPRESSION: 1. No evidence of acute  intracranial abnormality or acute cervical spine fracture. 2. Unchanged encephalomalacia in both cerebral hemispheres which may reflect a combination of remote trauma and old ischemic infarcts. Electronically Signed   By: Sebastian Ache M.D.   On: 02/13/2018 08:10   Ct Cervical Spine Wo Contrast  Result Date: 02/17/2018 CLINICAL DATA:  Found down, unresponsive.  CPR performed. EXAM: CT HEAD WITHOUT CONTRAST CT CERVICAL SPINE WITHOUT CONTRAST TECHNIQUE: Multidetector CT imaging of the head and cervical spine was performed following the standard protocol without intravenous contrast. Multiplanar CT image reconstructions of the cervical spine were also generated. COMPARISON:  02/17/2018 FINDINGS: CT HEAD FINDINGS Brain: Encephalomalacia is again seen in the left greater than right anterior frontal lobes as well as in the right frontal and parietal lobes towards the vertex. There is also a small chronic left occipital infarct. Atrophy with ex vacuo enlargement of the ventricles is unchanged. There is no evidence of acute infarct, intracranial hemorrhage, midline shift, or extra-axial fluid  collection. Vascular: Calcified atherosclerosis at the skull base. No hyperdense vessel. Skull: No acute fracture. 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 Sinuses/Orbits: Chronic left sphenoid sinus opacification which is contiguous with the clival erosion. Bilateral cataract extraction. Clear mastoid air cells. Other: None. CT CERVICAL SPINE FINDINGS Alignment: Unchanged.  No acute traumatic subluxation. Skull base and vertebrae: No acute fracture or destructive osseous process. Soft tissues and spinal canal: No prevertebral fluid or swelling. No visible canal hematoma. Disc levels: Mild-to-moderate cervical disc degeneration, greatest at C5-6 where uncovertebral spurring results in moderate right and mild left neural foraminal stenosis. Uncovertebral spurring and mild left facet arthrosis at C3-4 result in moderate left neural foraminal stenosis. Upper chest: Partially visualized pleuroparenchymal scarring and calcification in the left lung apex with milder changes on the right. Other: Partially visualized endotracheal and enteric tubes. Moderate left carotid artery calcified atherosclerosis. IMPRESSION: 1. No evidence of acute intracranial abnormality or acute cervical spine fracture. 2. Unchanged encephalomalacia in both cerebral hemispheres which may reflect a combination of remote trauma and old ischemic infarcts. Electronically Signed   By: Sebastian Ache M.D.   On: 02/04/2018 08:10   Dg Chest Portable 1 View  Result Date: 02/06/2018 CLINICAL DATA:  Status post cardiac arrest and CPR.  Intubated. EXAM: PORTABLE CHEST 1 VIEW COMPARISON:  None. FINDINGS: Endotracheal tube tip in the right mainstem bronchus. Possible nasogastric tube extending into the stomach. Normal sized heart. Dense aortic arch calcifications. The interstitial markings are mildly prominent there is some linear density in the right upper lobe and left lung base.  Diffuse osteopenia. IMPRESSION: 1. Mild left basilar atelectasis and possible early changes of aspiration pneumonitis. 2. Linear scarring in the right upper lobe. 3. Mild chronic interstitial lung disease. Electronically Signed   By: Beckie Salts M.D.   On: 02/22/2018 07:30    CULTURES: No results found for this or any previous visit.  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  ET tube needs to be withdrawn 3 cm. Continue Zosyn/vancomycin. Trach aspirate for Gram stain, culture/sensitivity. Albuterol/Pulmicort. Check proBNP.  CARDIOVASCULAR  Shock, unknown etiology Wean norepinephrine as tolerated. Stress dose steroids.  RENAL  Abnormal renal function, creatinine 2.07 (GFR 29), unknown baseline  Lactic acidosis Continue IV fluids (normal saline at 75 mL/h)  HEMATOLOGIC  Normocytic anemia Monitor hemoglobin  INFECTIOUS  Pyuria with rare bacteriuria Urine culture. Continue Zosyn/vancomycin for now.  ENDOCRINE  Type 2 diabetes mellitus with renal manifestations  Panhypopituitarism Sliding scale insulin. Stress dose steroids. Continue Synthroid.  NEUROLOGIC  Anoxic encephalopathy  History of stroke Prognosis is  dismal.  The patient is at high risk for brain stem herniation. Prolonged downtime with failure to achieve ROSC in 15+ minutes, along with his current neurological exam, argues against initiation of therapeutic hypothermia.  Avoid fever. For now, the patient remains a FULL CODE.  Of note, the patient is married.  His wife still lives independently; however, she was discharged from a hospitalization yesterday and did not feel well enough to come to the hospital today.  The patient's son does not believe that the patient's wife feels well enough to make a visit to the hospital; however, he has agreed to go home and inquire regarding advanced directives and CODE STATUS.  For now, the plan is to continue supportive measures for at least 48-72 hours, barring interjection by the patient's wife with a different direction of care.    Admit to ICU under my service (Attending: Marcelle SmilingSeong-Joo Sherwin Hollingshed, MD) with the diagnoses highlighted above in the active Hospital Problem List (ASSESSMENT).  NUTRITION: NPO for now except meds.  Clinical nutrition consult. DVT PROPHYLAXIS: Heparin GI PROPHYLAXIS: Protonix  FAMILY  - Updates: Son and daughter-in-law updated at the bedside.  - Inter-disciplinary family meet or Palliative Care meeting due by:  02/25/2018   Marcelle SmilingSeong-Joo Darel Ricketts, MD Board Certified by the ABIM, Pulmonary Diseases & Critical Care Medicine  [facility] Pager: [phone]  02/23/2018, 11:04 AM   Critical care time: 90 minutes. The treatment and management of the patient's condition was required based on the threat of imminent deterioration. This time reflects time spent by the physician evaluating, providing care and managing the critically ill patient's care. The time was spent at the immediate bedside (or on the same floor/unit and dedicated to this patient's care). Time involved in separately billable procedures is NOT included int he critical care time indicated above. Family meeting and  update time may be included above if and only if the patient is unable/incompetent to participate in clinical interview and/or decision making, and the discussion was necessary to determining treatment decisions.  Marcelle SmilingSeong-Joo Maximo Spratling, MD   Patient's wife has provided her son with a copy of the patient's living will and instruction to make him a DNR. Living will provided to nurse for copy to be filed in the chart. DNR order will be placed.  Marcelle SmilingSeong-Joo Kadeshia Kasparian, MD

## 2018-02-19 NOTE — ED Notes (Addendum)
Ice packs started within 10 of arrival. (06:55)

## 2018-02-19 NOTE — Code Documentation (Signed)
  Patient Name: Francisco Meyer   MRN: [id number]   Date of Birth/ Sex: 09/23/1941 , male      Admission Date: 08/17/18  Attending Provider: Marcelle SmilingJeong, Seong-Joo, MD  Primary Diagnosis: Anoxic encephalopathy [facility](HCC)   Indication: Pt was in his usual state of health until this PM, when he was noted to be PEA upon arrival to the hospital. Code blue was subsequently called in the elevator during transport to the ICU. At the time of arrival on scene, ACLS protocol was underway. There was marked decorticate posturing that was initially concerning for seizure like activity vs myoclonic jerks.    Technical Description:  - CPR performance duration:  15 minutes  - Was defibrillation or cardioversion used? No   - Was external pacer placed? No  - Was patient intubated pre/post CPR? Yes   Medications Administered: Y = Yes; Blank = No Amiodarone    Atropine    Calcium    Epinephrine    Lidocaine    Magnesium    Norepinephrine  Y  Phenylephrine    Sodium bicarbonate  Y  Ativan  Y   Post CPR evaluation:  - Final Status - Was patient successfully resuscitated ? Yes - What is current rhythm? NSR - What is current hemodynamic status? Unstable, made DNR by family  Miscellaneous Information:  - Labs sent, including: n/a  - Primary team notified?  Yes  - Family Notified? Yes  - Additional notes/ transfer status: n/a     Lanelle BalHarbrecht, Reylene Stauder, MD  08/17/18, 3:02 PM

## 2018-02-20 ENCOUNTER — Inpatient Hospital Stay (HOSPITAL_COMMUNITY): Payer: Medicare Other

## 2018-02-20 DIAGNOSIS — J9601 Acute respiratory failure with hypoxia: Secondary | ICD-10-CM

## 2018-02-20 LAB — BLOOD GAS, ARTERIAL
Acid-base deficit: 0.3 mmol/L (ref 0.0–2.0)
Bicarbonate: 22.1 mmol/L (ref 20.0–28.0)
Drawn by: 418751
FIO2: 50
LHR: 20 {breaths}/min
O2 SAT: 98.5 %
PATIENT TEMPERATURE: 98.6
PCO2 ART: 26 mmHg — AB (ref 32.0–48.0)
PEEP: 5 cmH2O
VT: 590 mL
pH, Arterial: 7.539 — ABNORMAL HIGH (ref 7.350–7.450)
pO2, Arterial: 135 mmHg — ABNORMAL HIGH (ref 83.0–108.0)

## 2018-02-20 LAB — BASIC METABOLIC PANEL
Anion gap: 14 (ref 5–15)
BUN: 30 mg/dL — ABNORMAL HIGH (ref 6–20)
CHLORIDE: 108 mmol/L (ref 101–111)
CO2: 21 mmol/L — ABNORMAL LOW (ref 22–32)
CREATININE: 2.12 mg/dL — AB (ref 0.61–1.24)
Calcium: 7.2 mg/dL — ABNORMAL LOW (ref 8.9–10.3)
GFR, EST AFRICAN AMERICAN: 33 mL/min — AB (ref >60)
GFR, EST NON AFRICAN AMERICAN: 28 mL/min — AB (ref >60)
Glucose, Bld: 74 mg/dL (ref 65–99)
POTASSIUM: 4.5 mmol/L (ref 3.5–5.1)
SODIUM: 143 mmol/L (ref 135–145)

## 2018-02-20 LAB — CBC
HCT: 28.7 % — ABNORMAL LOW (ref 39.0–52.0)
Hemoglobin: 9.3 g/dL — ABNORMAL LOW (ref 13.0–17.0)
MCH: 29.6 pg (ref 26.0–34.0)
MCHC: 32.4 g/dL (ref 30.0–36.0)
MCV: 91.4 fL (ref 78.0–100.0)
PLATELETS: 253 10*3/uL (ref 150–400)
RBC: 3.14 MIL/uL — AB (ref 4.22–5.81)
RDW: 15.8 % — ABNORMAL HIGH (ref 11.5–15.5)
WBC: 20.1 10*3/uL — ABNORMAL HIGH (ref 4.0–10.5)

## 2018-02-20 LAB — PHOSPHORUS: Phosphorus: 3.8 mg/dL (ref 2.5–4.6)

## 2018-02-20 LAB — GLUCOSE, CAPILLARY
GLUCOSE-CAPILLARY: 321 mg/dL — AB (ref 65–99)
Glucose-Capillary: 106 mg/dL — ABNORMAL HIGH (ref 65–99)
Glucose-Capillary: 202 mg/dL — ABNORMAL HIGH (ref 65–99)
Glucose-Capillary: 266 mg/dL — ABNORMAL HIGH (ref 65–99)
Glucose-Capillary: 345 mg/dL — ABNORMAL HIGH (ref 65–99)

## 2018-02-20 LAB — TRIGLYCERIDES: TRIGLYCERIDES: 124 mg/dL (ref <150)

## 2018-02-20 LAB — URINE CULTURE: Culture: NO GROWTH

## 2018-02-20 LAB — MAGNESIUM: Magnesium: 1.7 mg/dL (ref 1.7–2.4)

## 2018-02-20 MED ORDER — LEVETIRACETAM IN NACL 1000 MG/100ML IV SOLN
1000.0000 mg | Freq: Once | INTRAVENOUS | Status: AC
  Administered 2018-02-20: 1000 mg via INTRAVENOUS
  Filled 2018-02-20: qty 100

## 2018-02-20 MED ORDER — LOPERAMIDE HCL 2 MG PO CAPS: 2.0000 mg | ORAL_CAPSULE | ORAL | Status: DC | PRN

## 2018-02-20 MED ORDER — PIPERACILLIN-TAZOBACTAM 3.375 G IVPB 30 MIN
3.3750 g | Freq: Once | INTRAVENOUS | Status: AC
  Administered 2018-02-20: 3.375 g via INTRAVENOUS
  Filled 2018-02-20: qty 50

## 2018-02-20 MED ORDER — LEVETIRACETAM IN NACL 500 MG/100ML IV SOLN
500.0000 mg | Freq: Two times a day (BID) | INTRAVENOUS | Status: DC
  Administered 2018-02-20 – 2018-02-21 (×3): 500 mg via INTRAVENOUS
  Filled 2018-02-20 (×5): qty 100

## 2018-02-20 MED ORDER — IPRATROPIUM-ALBUTEROL 0.5-2.5 (3) MG/3ML IN SOLN
3.0000 mL | Freq: Four times a day (QID) | RESPIRATORY_TRACT | Status: DC
  Administered 2018-02-20 – 2018-02-21 (×4): 3 mL via RESPIRATORY_TRACT
  Filled 2018-02-20 (×4): qty 3

## 2018-02-20 MED ORDER — LOPERAMIDE HCL 2 MG PO CAPS: 4.0000 mg | ORAL_CAPSULE | Freq: Once | ORAL | Status: DC

## 2018-02-20 MED ORDER — PROPOFOL 1000 MG/100ML IV EMUL
5.0000 ug/kg/min | INTRAVENOUS | Status: DC
  Administered 2018-02-20 (×4): 45 ug/kg/min via INTRAVENOUS
  Administered 2018-02-20: 20 ug/kg/min via INTRAVENOUS
  Administered 2018-02-20 – 2018-02-21 (×2): 45 ug/kg/min via INTRAVENOUS
  Filled 2018-02-20 (×7): qty 100

## 2018-02-20 MED ORDER — PIPERACILLIN-TAZOBACTAM 3.375 G IVPB
3.3750 g | Freq: Three times a day (TID) | INTRAVENOUS | Status: DC
Start: 2018-02-20 — End: 2018-02-21
  Administered 2018-02-20 – 2018-02-21 (×4): 3.375 g via INTRAVENOUS
  Filled 2018-02-20 (×6): qty 50

## 2018-02-20 NOTE — Procedures (Signed)
Date of recording 02/20/2018  Referring physician Dr. Jerrell BelfastAurora  Reason for the study Cardiac arrest, unresponsive, gently on ventilatory support, propofol was turned off in the later half of the recording.  Technical Digital EEG recording using 10-20 electrode international system.  Description of the recording EEG comprised of frequent generalized spike and wave discharges 1-3 Hz/s lasting 1-2 seconds with burst suppression pattern in between. After turning off sedation these discharges were more frequent and lasting longer up to 3-4 seconds.  Impression Myoclonic status epilepticus

## 2018-02-20 NOTE — Progress Notes (Signed)
Informed Dr. Ardeth PerfectJeong that his temp was 9393 F.  Dr. Ardeth PerfectJeong said to only focus on if patient becomes hyperthermic and not hypothermic. No new orders received.   Erick Blinksuchman, Yvone Slape D, RN

## 2018-02-20 NOTE — Progress Notes (Signed)
EEG complete - results pending 

## 2018-02-20 NOTE — Progress Notes (Signed)
Informed Dr. Ardeth PerfectJeong pt was 92.68F.  ordered to keep patient at 95-28F.  Erick Blinksuchman, Alexarae Oliva D, RN

## 2018-02-20 NOTE — Progress Notes (Signed)
HISTORY & PHYSICAL  Patient Name: Francisco Meyer MRN: [id number] DOB: [birth year]    ADMISSION DATE:  02/28/2018 DATE OF SERVICE:  03/01/2018  CHIEF COMPLAINT:  Anoxic encephalopathy, post-CPR resuscitation   HISTORY OF PRESENT ILLNESS  This 77 y.o. Caucasian male presented to the [facility] Emergency Department from an assisted living facility via EMS with complaints of anoxic encephalopathy.  At the time of clinical interview, the patient is intubated and unresponsive and unable to provide any information.  The patient's son and daughter-in-law are at the bedside.  The patient's son reports that the assisted living staff reported that the patient was awakened around 5 AM.  Facility staff assisted the patient to the bathroom.  Approximately 15 minutes later, the staff went back to check on the patient and went up and were unable able to open the bathroom door because the patient had fallen against it.  When they managed to get in, they found that he was pulseless.  EMS his first assessment was reported to be asystole on the monitor.  Documented downtime suggests at least 15-25 minutes without ROSC (911 call to initiation of CPR was 15 minutes).  CPR was provided with a Samuel Bouche device in addition to 2 amps of epinephrine.  In the emergency department, the patient has been on norepinephrine infusion and intravenous fluid boluses.  Currently, he is in a sinus rhythm and is hypertensive.  The patient has been living in assisted living facility for several years (at least 8 years).  The patient and his wife previously lived in [place].  The patient's son moved his parents up to [place].  Overall, the patient has been in deteriorating health.  He is very unsteady on his gait and requires use of a walker and assistance.  He still experiences frequent falls, as evidenced by multiple bruises, avulsions and lacerations on his body.  Subjective/interval events: Patient exhibiting  myoclonus, has been seen by neurology Alkalotic on ABG from this morning  VITAL SIGNS: BP 134/77   Pulse 63   Temp (!) 95.4 F (35.2 C) (Bladder)   Resp 20   Ht 5\' 10"  (1.778 m)   Wt 79.6 kg (175 lb 7.8 oz)   SpO2 100%   BMI 25.18 kg/m     VENTILATOR SETTINGS: Vent Mode: PRVC FiO2 (%):  [40 %-100 %] 40 % Set Rate:  [14 bmp-20 bmp] 20 bmp Vt Set:  [590 mL-5290 mL] 590 mL PEEP:  [5 cmH20] 5 cmH20 Plateau Pressure:  [14 cmH20-33 cmH20] 14 cmH20  INTAKE / OUTPUT: I/O last 3 completed shifts: In: 2140.5 [I.V.:2090.5; IV Piggyback:50] Out: 530 [Urine:530]  PHYSICAL EXAMINATION: General: Ill-appearing man, intubated, sedated Neuro: On propofol 45, no current evidence of my clonus, does not respond to voice, pain.  He does have a spontaneous respiratory drive HEENT: ET tube in place, no oral lesions Lungs: Distant bilaterally, decreased at both bases, no wheezing  CV: Regular, no murmur Abd: Soft, obese, positive bowel sounds MSK: No deformities Ext: No edema Skin: No rash   LABS:  BASIC METABOLIC PROFILE Recent Labs  Lab 02/01/2018 0659 02/24/2018 1116 02/20/18 0113  NA 135  --  143  K 4.6  --  4.5  CL 102  --  108  CO2 15*  --  21*  BUN 28*  --  30*  CREATININE 2.07* 1.81* 2.12*  GLUCOSE 278*  --  74  CALCIUM 8.4*  --  7.2*  MG  --   --  1.7  PHOS  --   --  3.8    Glucose Recent Labs  Lab 01/31/2018 1227 02/13/2018 1646 02/10/2018 2044 02/13/2018 2325 02/20/18 0402 02/20/18 0727  GLUCAP 246* 230* 84 76 106* 202*    Liver Enzymes Recent Labs  Lab 02/25/2018 0659  AST 97*  ALT 54  ALKPHOS 88  BILITOT 0.7  ALBUMIN 2.8*    CBC Recent Labs  Lab 02/25/2018 0659 02/28/2018 1116 02/20/18 0113  WBC 8.4 18.0* 20.1*  HGB 9.7* 10.2* 9.3*  HCT 31.0* 32.2* 28.7*  PLT 284 315 253    COAGULATION STUDIES No results for input(s): APTT, INR in the last 168 hours.  SEPSIS MARKERS Recent Labs  Lab 02/20/2018 0702 02/25/2018 1118 02/09/2018 1414  LATICACIDVEN 6.79*  2.0* 5.5*    ABG Recent Labs  Lab 02/14/2018 1610 02/28/2018 2035 02/20/18 0338  PHART 7.260* 7.283* 7.539*  PCO2ART 39.1 44.7 26.0*  PO2ART 126* 134* 135*    Cardiac Enzymes No results for input(s): TROPONINI, PROBNP in the last 168 hours.  Imaging Dg Abd 1 View  Result Date: 02/20/2018 CLINICAL DATA:  Orogastric tube placement. EXAM: ABDOMEN - 1 VIEW COMPARISON:  Abdomen and pelvis CT dated 03/07/2016. Portable chest obtained today. FINDINGS: Paucity of intestinal gas. Orogastric tube tip and side hole in the proximal stomach. Prominent interstitial markings at the lung bases with a normal sized heart. Unremarkable bones. IMPRESSION: 1. Orogastric tube tip and side hole in the proximal stomach. 2. Prominent interstitial markings at the lung bases, described on the portable chest radiograph report. Electronically Signed   By: Beckie SaltsSteven  Reid M.D.   On: 02/20/2018 09:06   Ct Head Wo Contrast  Result Date: 02/20/2018 CLINICAL DATA:  Hypoxic ischemic encephalopathy EXAM: CT HEAD WITHOUT CONTRAST TECHNIQUE: Contiguous axial images were obtained from the base of the skull through the vertex without intravenous contrast. COMPARISON:  Head CT 03/01/2018 FINDINGS: Brain: No mass lesion, intraparenchymal hemorrhage or extra-axial collection. No evidence of acute cortical infarct. Unchanged extensive bifrontal encephalomalacia with ex vacuo dilatation of the lateral ventricles. Vascular: No hyperdense vessel or unexpected vascular calcification. Skull: Normal visualized skull base, calvarium and extracranial soft tissues. Sinuses/Orbits: No sinus fluid levels or advanced mucosal thickening. No mastoid effusion. Normal orbits. IMPRESSION: Unchanged bifrontal encephalomalacia without acute intracranial abnormality. Electronically Signed   By: Deatra RobinsonKevin  Herman M.D.   On: 02/20/2018 06:15   Dg Chest Port 1 View  Result Date: 02/20/2018 CLINICAL DATA:  Intubated. EXAM: PORTABLE CHEST 1 VIEW COMPARISON:  None.  FINDINGS: Endotracheal tube tip 2 cm above the carina. Orogastric tube tip extending into the stomach with its side hole in the proximal stomach. Stable diffusely prominent interstitial markings and biapical pleural and parenchymal scarring. Less prominent nodular opacity at the left lung base. Diffuse osteopenia. IMPRESSION: 1. Less prominent nodular opacity at the left lung base, possibly representing focal infection or atelectasis. Follow-up chest radiographs are recommended until clear. 2. Stable chronic interstitial lung disease. 3. Endotracheal tube tip 2 cm above the carina. This could be retracted 3 cm. Electronically Signed   By: Beckie SaltsSteven  Reid M.D.   On: 02/20/2018 09:10   Dg Chest Portable 1 View  Result Date: 03/01/2018 CLINICAL DATA:  77 year old male status post ETT adjustment EXAM: PORTABLE CHEST 1 VIEW COMPARISON:  Prior chest x-ray earlier today 02/11/2018 FINDINGS: The endotracheal tube is in good position approximately 3 cm above the carina. A gastric tube remains unchanged. The tip lies below the diaphragm, presumably within the stomach. External defibrillator  pads again noted overlying the chest. Similar appearance of the lungs with biapical pleuroparenchymal scarring, hyperinflation, upper lung predominant emphysema, and a left lower lobe nodular airspace opacity. No pneumothorax or pleural effusion. IMPRESSION: 1. The endotracheal tube has been adjusted and now lies 3 cm above the carina in good position. 2. Nodular airspace opacity in the left lower lobe may reflect a focus of infection/inflammation, or a pulmonary nodule. Following resolution of the patient's acute symptoms, CT scan of the chest may be helpful for further evaluation. Electronically Signed   By: Malachy Moan M.D.   On: 02/20/2018 12:48    CULTURES: Results for orders placed or performed during the hospital encounter of 02/04/2018  Urine culture     Status: None   Collection Time: 02/25/2018  8:53 AM  Result Value  Ref Range Status   Specimen Description URINE, CATHETERIZED  Final   Special Requests NONE  Final   Culture   Final    NO GROWTH Performed at [facility], [address] [place], [place] [address]    Report Status 02/20/2018 FINAL  Final  MRSA PCR Screening     Status: None   Collection Time: 02/18/2018  4:21 PM  Result Value Ref Range Status   MRSA by PCR NEGATIVE NEGATIVE Final    Comment:        The GeneXpert MRSA Assay (FDA approved for NASAL specimens only), is one component of a comprehensive MRSA colonization surveillance program. It is not intended to diagnose MRSA infection nor to guide or monitor treatment for MRSA infections. Performed at [facility], 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)   By systems: PULMONARY  Endotracheal intubation  Aspiration pneumonia  Chronic lung disease, NOS  Alkalosis, combined respiratory plus bicarbonate infusion Plan: Continue same ventilator strategy, decreased respiratory rate 14 given alkalosis Continue empiric antibiotics, vancomycin, Zosyn Follow intermittent chest x-ray Pulmonary hygiene Bronchodilators as ordered   CARDIOVASCULAR  Shock, unknown etiology, in part due to sedating meds, possible sepsis in the setting of pneumonia Continue norepinephrine, wean as tolerated Continue stress dose  steroids   RENAL  Acute renal insufficiency  Lactic acidosis /superimposed respiratory acidosis, improved Continue IV fluid resuscitation Decrease rate of bicarbonate, adjust respiratory rate down as above  HEMATOLOGIC  Normocytic anemia Follow CBC   INFECTIOUS  Pyuria with rare bacteriuria  Suspected aspiration pneumonia Cultures pending, follow and tailor antibiotics as appropriate Empiric vancomycin, Zosyn as ordered, narrow as able  ENDOCRINE  Type 2 diabetes mellitus with renal manifestations  Panhypopituitarism Continue sliding scale insulin as ordered, adjust given steroids Continue stress dose steroids as ordered Continue Synthroid  NEUROLOGIC  Anoxic hypoxemic encephalopathy, associated myoclonus  History of stroke Discussed case with Dr. Wilford Corner with neurology 4/21.  EEG and MRI under review.  Prognosis here is poor for meaningful neurological recovery given his myoclonus.  We will continue his current stabilizing interventions, follow for at least another  24 hours to look for clinical change.  Continue current Keppra, propofol.  Review his studies and then discussed prognosis with family.  They are realistic and suspect that we will transition to a withdrawal of care based on that information.  He is DNR if he were to decompensate in the interim.  His wife is ill at home, recently hospitalized.  We will allow her to participate in the decision-making, visit him as she is able to tolerate.   NUTRITION: NPO for now except meds.  Clinical nutrition consult. DVT PROPHYLAXIS: Heparin GI PROPHYLAXIS: Protonix  FAMILY  - Updates: Son at bedside 4/21  - Inter-disciplinary family meet or Palliative Care meeting due by:  02/25/2018   Independent critical care time 35 minutes  Levy Pupa, MD, PhD 02/20/2018, 9:41 AM [facility] [phone] or if no answer [phone]

## 2018-02-20 NOTE — Progress Notes (Signed)
Pharmacy Antibiotic Note  Francisco Meyer is a 77 y.o. male admitted on 2018/09/23 with sepsis.  Pharmacy has been consulted for Zosyn dosing.  Plan: Zosyn 3.375g IV q8h (4 hour infusion).  Height: 5\' 10"  (177.8 cm) Weight: 175 lb (79.4 kg) IBW/kg (Calculated) : 73  Temp (24hrs), Avg:96.9 F (36.1 C), Min:91.8 F (33.2 C), Max:101.5 F (38.6 C)  Recent Labs  Lab 2018-11-01 0659 2018-11-01 0702 2018-11-01 1116 2018-11-01 1118 2018-11-01 1414  WBC 8.4  --  18.0*  --   --   CREATININE 2.07*  --  1.81*  --   --   LATICACIDVEN  --  6.79*  --  2.0* 5.5*    Estimated Creatinine Clearance: 34.7 mL/min (A) (by C-G formula based on SCr of 1.81 mg/dL (H)).    No Known Allergies   Thank you for allowing pharmacy to be a part of this patient's care.  Francisco Meyer, PharmD, BCPS  02/20/2018 12:21 AM

## 2018-02-20 NOTE — Consult Note (Signed)
Neurology Consultation Reason for Consult: Myoclonus Referring Physician: Laural BenesSommer, S  CC: Abnormal movements  History is obtained from: Patient  HPI: Francisco Meyer is a 77 y.o. male who suffered 2 separate cardiac arrest today.  The initial one was early the morning of 4/20 when he was found with unknown downtime.  CPR was done for 15 minutes, after 2 doses of epinephrine they had return of pulses.  He subsequently around 1pm had a second code and again had return of spontaneous circulation.  Following this, his family provided his living will which instructed DNR status.  He has therefore been made DNR.  Even as early as the second code, there was mention of abnormal movements concerning for myoclonus.  These have been increasing in frequency and therefore neurology has been consulted.     ROS:  Unable to obtain due to altered mental status.   Past medical history: Unable to obtain due to altered mental status   Family history: Unable to obtain due to altered mental status   Social History:  has no tobacco, alcohol, and drug history on file.   Exam: Current vital signs: BP (!) 123/55   Pulse (!) 103   Temp (!) 100.8 F (38.2 C)   Resp (!) 24   Ht 5\' 10"  (1.778 m)   Wt 79.4 kg (175 lb)   SpO2 100%   BMI 25.11 kg/m  Vital signs in last 24 hours: Temp:  [91.8 F (33.2 C)-101.5 F (38.6 C)] 100.8 F (38.2 C) (04/21 0145) Pulse Rate:  [69-148] 103 (04/21 0145) Resp:  [0-40] 24 (04/21 0145) BP: (67-157)/(50-101) 123/55 (04/21 0000) SpO2:  [90 %-100 %] 100 % (04/21 0145) Arterial Line BP: (83-156)/(42-71) 140/54 (04/21 0145) FiO2 (%):  [40 %-100 %] 50 % (04/20 2337) Weight:  [79.4 kg (175 lb)] 79.4 kg (175 lb) (04/20 0747)   Physical Exam  Constitutional: Appears well-developed and well-nourished.  Psych: Unresponsive Eyes: No scleral injection HENT: ET tube in place Head: Normocephalic.  Cardiovascular: Normal rate and regular rhythm.  Respiratory:  Ventilated GI: Soft.  No distension. There is no tenderness.  Skin: WDI  Neuro: Mental Status: Patient is comatose, eyes do not open except associated with myoclonus. Cranial Nerves: II: Pupils are equal, round, and reactive to light.   III,IV, VI: Eyes are dysconjugate, no movement with doll's eyes V:VII: Corneals are absent X: Cough is absent  Motor: Mild extension to noxious stimulation in the left upper extremity, otherwise no movement, frequent generalized myoclonic jerks Sensory: As above  Cerebellar: Does not perform  I have reviewed labs in epic and the results pertinent to this consultation are: Leukocytosis 20 A1c 9.7 Creatinine 2.07  I have reviewed the images obtained: CT head-no acute findings, but there is encephalomalacia  Impression: 77 year old male with anoxic injury after cardiac arrest. 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 Given this developing so early, this is associated with poor prognosis. The movements can be helped with anti-epilepticsu sometimes, or with propofol. This does not, however, improve outcome.   Recommendations: 1) Keppra 1 g x 1.  Depakote 30 mg/kg 2) Propofol can help reduce the movements. 3) EEG in the morning 4) repeat CT in the morning 5) neurology will follow   Ritta SlotMcNeill Kirkpatrick, MD [facility] [phone]  If 7pm- 7am, please page neurology on call as listed in AMION.

## 2018-02-20 NOTE — Progress Notes (Signed)
eLink Physician-Brief Progress Note Patient Name: Francisco Meyer DOB: 03/26/1941 MRN: [id number]   Date of Service  02/20/2018  HPI/Events of Note  Fever to 101.5 F - AST = 97 and Creatinine = 1.81, therefore, will avoid Tylenol and Motrin. WBC = 18.0. Currently on Vancomycin. Tracheal aspirate culture pending.   eICU Interventions  Will order: 1. Blood cultures X 2.  2. Restart Zosyn per pharmacy consult.  3. Cooling blanket PRN.     Intervention Category Major Interventions: Infection - evaluation and management  Analee Montee Eugene 02/20/2018, 12:00 AM

## 2018-02-20 NOTE — Progress Notes (Signed)
Same day progress note  Seen and examined. No acute changes. No jerking movements since being on sedation. Repeat CTH with no acute changes  O: Breathing with the vent No spontaneous movements noted off of propofol Pupils 2mm and very sluggish. Corneals absent No response to nox stim  A: -Hypoxic/anoxic brain injury s/p cardiac arrest - usually a bad prognosticator if myoclonus present early after arrest. -Myoclonus-likely secondary to the hypoxic/anoxic insult  Recs: -C/W Keppra -C/W Depakote -EEG pending - will update after result availability -Consider MRI brain w/o contrast to assess for the degree of insult to the brain Will follow  -- Milon DikesAshish Wylder Macomber, MD [facility] Pager: [phone] If 7pm to 7am, please call on call as listed on AMION.

## 2018-02-20 NOTE — Progress Notes (Signed)
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 If no response to Prop and Keppra, can use paralytics. Family meeting tommorw per Dr. Delton CoombesByrum. Possible consideration for withdrawal of care tomorrow.  -- Milon DikesAshish Narely Nobles, MD [facility] Pager: [phone] If 7pm to 7am, please call on call as listed on AMION.

## 2018-02-20 NOTE — Progress Notes (Signed)
Pt transported to MRI via ventilator. Pt's status remained unchanged throughout transport. RT notified that pt has returned to room.

## 2018-02-20 NOTE — Progress Notes (Signed)
Decreased rate to 14 per Dr. Delton CoombesByrum.

## 2018-02-20 NOTE — Progress Notes (Signed)
eLink Physician-Brief Progress Note Patient Name: Francisco Meyer DOB: 10/20/1941 MRN: [id number]   Date of Service  02/20/2018  HPI/Events of Note  Anoxic Myoclonus vs Seizure - Currently on a Versed IV infusion at 6 mg/hour.   eICU Interventions  Will order: 1. Increase ceiling on Versed IV infusion to 8 mg/hour.  2. Keppra 1000 mg IV X 1 now, then Keppra 500 mg IV Q 12 hours.  3. Will ask Neurology to see the patient in consultation.      Intervention Category Major Interventions: Other:  Francisco Meyer 02/20/2018, 1:15 AM

## 2018-02-21 DIAGNOSIS — J9600 Acute respiratory failure, unspecified whether with hypoxia or hypercapnia: Secondary | ICD-10-CM

## 2018-02-21 LAB — BASIC METABOLIC PANEL
Anion gap: 11 (ref 5–15)
BUN: 23 mg/dL — ABNORMAL HIGH (ref 6–20)
CO2: 24 mmol/L (ref 22–32)
CREATININE: 1.88 mg/dL — AB (ref 0.61–1.24)
Calcium: 7 mg/dL — ABNORMAL LOW (ref 8.9–10.3)
Chloride: 101 mmol/L (ref 101–111)
GFR calc non Af Amer: 33 mL/min — ABNORMAL LOW (ref >60)
GFR, EST AFRICAN AMERICAN: 38 mL/min — AB (ref >60)
Glucose, Bld: 201 mg/dL — ABNORMAL HIGH (ref 65–99)
Potassium: 2.8 mmol/L — ABNORMAL LOW (ref 3.5–5.1)
Sodium: 136 mmol/L (ref 135–145)

## 2018-02-21 LAB — GLUCOSE, CAPILLARY
GLUCOSE-CAPILLARY: 84 mg/dL (ref 65–99)
Glucose-Capillary: 144 mg/dL — ABNORMAL HIGH (ref 65–99)
Glucose-Capillary: 185 mg/dL — ABNORMAL HIGH (ref 65–99)
Glucose-Capillary: 252 mg/dL — ABNORMAL HIGH (ref 65–99)
Glucose-Capillary: 54 mg/dL — ABNORMAL LOW (ref 65–99)

## 2018-02-21 MED ORDER — IPRATROPIUM-ALBUTEROL 0.5-2.5 (3) MG/3ML IN SOLN
3.0000 mL | Freq: Three times a day (TID) | RESPIRATORY_TRACT | Status: DC
  Administered 2018-02-21: 3 mL via RESPIRATORY_TRACT
  Filled 2018-02-21: qty 3

## 2018-02-21 MED ORDER — MORPHINE BOLUS VIA INFUSION
2.0000 mg | INTRAVENOUS | Status: DC | PRN
  Filled 2018-02-21: qty 4

## 2018-02-21 MED ORDER — MORPHINE 100MG IN NS 100ML (1MG/ML) PREMIX INFUSION
1.0000 mg/h | INTRAVENOUS | Status: DC
  Administered 2018-02-21: 10 mg/h via INTRAVENOUS
  Filled 2018-02-21: qty 100

## 2018-02-21 MED ORDER — HYDROCORTISONE NA SUCCINATE PF 100 MG IJ SOLR: 100.0000 mg | Freq: Two times a day (BID) | INTRAMUSCULAR | Status: DC

## 2018-02-21 MED ORDER — DEXTROSE 50 % IV SOLN
INTRAVENOUS | Status: AC
  Administered 2018-02-21: 50 mL
  Filled 2018-02-21: qty 50

## 2018-02-21 MED FILL — Medication: Qty: 1 | Status: AC

## 2018-02-23 ENCOUNTER — Telehealth: Payer: Self-pay

## 2018-02-23 NOTE — Telephone Encounter (Signed)
On 02/23/18 I [facility] a [facility] The d/c is for cremation. The patient is a patient of Doctor Vassie Lolllva. [facility].  On 02/23/18 I received the d/c back from Doctor Vassie LollAlva. I got the d/c ready and called the funeral home to let them know the d/c is ready for pickup.

## 2018-02-24 LAB — CULTURE, BLOOD (ROUTINE X 2)
Culture: NO GROWTH
Culture: NO GROWTH
SPECIAL REQUESTS: ADEQUATE
SPECIAL REQUESTS: ADEQUATE

## 2018-02-25 LAB — CULTURE, BLOOD (ROUTINE X 2)
Culture: NO GROWTH
Culture: NO GROWTH
SPECIAL REQUESTS: ADEQUATE
Special Requests: ADEQUATE

## 2018-03-02 NOTE — Discharge Summary (Signed)
  HISTORY & PHYSICAL  Patient Name: Francisco Meyer MRN: [id number] DOB: 12/27/1940    ADMISSION DATE:  04-03-2018 DATE OF SERVICE:  04-03-2018  CHIEF COMPLAINT:  Anoxic encephalopathy, post-CPR resuscitation   HISTORY OF PRESENT ILLNESS  This 77 y.o. man adm 4/20 with asytole arrest , downtime x 20 mins Then developed myoclonic status He has a history of hypertension, diabetes and panhypopituitarism   ANTIBIOTICS: Zosyn (4/20-) Vancomycin (4/28-)  SIGNIFICANT EVENTS: 4/20: Fell in bathroom, unresponsive, asystole, prolonged CPR, intubated in the ER  LINES/TUBES: 4/20: Left intraosseous   ASSESSMENT / PLAN: Principal Problem:   Anoxic encephalopathy (HCC) Active Problems:   Endotracheally intubated   Aspiration pneumonia (HCC)   Lactic acidosis   Shock (HCC)   Chronic lung disease   Abnormal renal function   Type 2 diabetes mellitus with renal manifestations (HCC)   Normocytic anemia   Pyuria   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  EEG and MRI were nondiagnostic.  Poor prognosis for meaningful neurological recovery was given to patient's son Loraine LericheMark and his wife.  They agreed to withdrawal of life support in the past episode after   Cause of death-acute respiratory failure, anoxic encephalopathy post cardiac arrest, pan hypopituitarism   02/23/2018, 1:17 PM   Gerell Fortson V. Vassie LollAlva MD

## 2018-03-02 NOTE — Progress Notes (Signed)

## 2018-03-02 NOTE — Procedures (Signed)
Extubation Procedure Note  Patient Details:   Name: Francisco Meyer DOB: 09/13/1941 MRN: [id number]   Airway Documentation:   Patient extubated per end of life protocol, withdrawal of life support. Patient placed on 2lpm nasal cannula. RN at bedside. Vent end date: (not recorded) Vent end time: (not recorded)   Evaluation  O2 sats: stable throughout Complications: No apparent complications Patient did tolerate procedure well. Bilateral Breath Sounds: Clear, Diminished   No  Suszanne ConnersLaura P Amaya Blakeman 02/24/2018, 4:36 PM

## 2018-03-02 NOTE — Progress Notes (Signed)
20 cc morphine wasted witnessed by Nechama GuardJennifer Motley RN

## 2018-03-02 NOTE — Progress Notes (Addendum)
HISTORY & PHYSICAL  Patient Name: Francisco Meyer MRN: [id number] DOB: 01/28/1941    ADMISSION DATE:  02/06/2018 DATE OF SERVICE:  02/25/2018  CHIEF COMPLAINT:  Anoxic encephalopathy, post-CPR resuscitation   HISTORY OF PRESENT ILLNESS  This 77 y.o. man adm 4/20 with asytole arrest , downtime x 20 mins Then developed myoclonic status  Subjective/interval events: Remains comatose Afebrile CBG 54 this am  VITAL SIGNS: BP (!) 158/63   Pulse 86   Temp (!) 95.7 F (35.4 C)   Resp 16   Ht 5\' 10"  (1.778 m)   Wt 191 lb 2.2 oz (86.7 kg)   SpO2 99%   BMI 27.43 kg/m     VENTILATOR SETTINGS: Vent Mode: PRVC FiO2 (%):  [40 %] 40 % Set Rate:  [14 bmp] 14 bmp Vt Set:  [590 mL] 590 mL PEEP:  [5 cmH20] 5 cmH20 Plateau Pressure:  [14 cmH20-26 cmH20] 26 cmH20  INTAKE / OUTPUT: I/O last 3 completed shifts: In: 4312.8 [I.V.:3712.8; IV Piggyback:600] Out: 1345 [Urine:1265; Emesis/NG output:80]  PHYSICAL EXAMINATION: General: chr Ill-appearing man, intubated, sedated Neuro: comatose, pupils 3mm BERTL, chin quivering & left foot myoclonic jerks to DPS HEENT: ET tube in place, no oral lesions Lungs: Distant bilaterally, decreased at both bases, no wheezing  CV: Regular, no murmur Abd: Soft, obese, positive bowel sounds MSK: No deformities Ext: No edema Skin: No rash   LABS:  BASIC METABOLIC PROFILE Recent Labs  Lab 03/01/2018 0659 01/31/2018 1116 02/20/18 0113 16-Aug-2018 0345  NA 135  --  143 136  K 4.6  --  4.5 2.8*  CL 102  --  108 101  CO2 15*  --  21* 24  BUN 28*  --  30* 23*  CREATININE 2.07* 1.81* 2.12* 1.88*  GLUCOSE 278*  --  74 201*  CALCIUM 8.4*  --  7.2* 7.0*  MG  --   --  1.7  --   PHOS  --   --  3.8  --     Glucose Recent Labs  Lab 02/20/18 2011 16-Aug-2018 0026 16-Aug-2018 0401 16-Aug-2018 0829 16-Aug-2018 1140 16-Aug-2018 1209  GLUCAP 321* 252* 185* 84 54* 144*    Liver Enzymes Recent Labs  Lab 02/02/2018 0659  AST 97*  ALT 54  ALKPHOS 88  BILITOT 0.7    ALBUMIN 2.8*    CBC Recent Labs  Lab 02/24/2018 0659 02/06/2018 1116 02/20/18 0113  WBC 8.4 18.0* 20.1*  HGB 9.7* 10.2* 9.3*  HCT 31.0* 32.2* 28.7*  PLT 284 315 253    COAGULATION STUDIES No results for input(s): APTT, INR in the last 168 hours.  SEPSIS MARKERS Recent Labs  Lab 02/08/2018 0702 02/06/2018 1118 02/16/2018 1414  LATICACIDVEN 6.79* 2.0* 5.5*    ABG Recent Labs  Lab 02/16/2018 1610 02/24/2018 2035 02/20/18 0338  PHART 7.260* 7.283* 7.539*  PCO2ART 39.1 44.7 26.0*  PO2ART 126* 134* 135*    Cardiac Enzymes No results for input(s): TROPONINI, PROBNP in the last 168 hours.  Imaging No results found.  CULTURES: Results for orders placed or performed during the hospital encounter of 02/03/2018  Urine culture     Status: None   Collection Time: 02/15/2018  8:53 AM  Result Value Ref Range Status   Specimen Description URINE, CATHETERIZED  Final   Special Requests NONE  Final   Culture   Final    NO GROWTH Performed at [facility], [address] [place], [place] [address]    Report Status 02/20/2018  FINAL  Final  Blood culture (routine x 2)     Status: None (Preliminary result)   Collection Time: 02/16/2018 12:15 PM  Result Value Ref Range Status   Specimen Description BLOOD RIGHT ANTECUBITAL  Final   Special Requests   Final    BOTTLES DRAWN AEROBIC ONLY Blood Culture adequate volume   Culture   Final    NO GROWTH 1 DAY Performed at [facility], [address], [place], [place] [address]    Report Status PENDING  Incomplete  Blood culture (routine x 2)     Status: None (Preliminary result)   Collection Time: 02/23/2018 12:30 PM  Result Value Ref Range Status   Specimen Description BLOOD LEFT A-LINE  Final   Special Requests   Final    BOTTLES DRAWN AEROBIC ONLY Blood Culture adequate volume   Culture   Final    NO GROWTH 1 DAY Performed at [facility], [address], [place], [place] [address]    Report Status PENDING  Incomplete   MRSA PCR Screening     Status: None   Collection Time: 02/23/2018  4:21 PM  Result Value Ref Range Status   MRSA by PCR NEGATIVE NEGATIVE Final    Comment:        The GeneXpert MRSA Assay (FDA approved for NASAL specimens only), is one component of a comprehensive MRSA colonization surveillance program. It is not intended to diagnose MRSA infection nor to guide or monitor treatment for MRSA infections. Performed at [facility], 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)   By systems: PULMONARY  Acute resp failure  Chronic lung disease, NOS  Alkalosis, combined respiratory plus bicarbonate infusion Plan: Continue same ventilator strategy, decreased respiratory rate 14 given alkalosis Pulmonary hygiene Bronchodilators as ordered   CARDIOVASCULAR  Shock, unknown etiology, in part due to sedating meds, possible sepsis in the setting of pneumonia Off norepinephrine    RENAL  Acute renal insufficiency  Lactic acidosis /superimposed respiratory acidosis, improved  Dc bicarbonate  HEMATOLOGIC  Normocytic anemia Follow CBC   INFECTIOUS  Pyuria with rare bacteriuria  Suspected aspiration pneumonia Continue empiric Zosyn , dc vanc  ENDOCRINE  Type 2 diabetes mellitus with renal manifestations  Panhypopituitarism SSI Continue stress dose steroids  Continue  Synthroid  NEUROLOGIC  Anoxic hypoxemic encephalopathy, associated myoclonus  History of stroke Discussed case with  neurology .  EEG and MRI reviewed   Prognosis here is poor for meaningful neurological recovery given his myoclonus.  Conveyed to son Loraine Leriche. He will bring mom in at 3 pm & agrees to withdrawal of life support He is DNR .  His wife is ill at home, recently hospitalized.     FAMILY  - Updates: Son mark 4/21  - Inter-disciplinary family meet or Palliative Care meeting due by:  02/25/2018   Independent critical care time 35 minutes  Cyril Mourning MD. [facility]. [facility] Pager [phone] If no response call [phone]    2018/03/20, 12:22 PM  Addendum-discussed with son Loraine Leriche and patient's wife.  Withdrawal of life support orders written, comfort care  Rakesh V. Vassie Loll MD

## 2018-03-02 NOTE — Progress Notes (Signed)
Reason for consult: Prognostication for anoxic injury  Subjective: Patient continues to have rhythmic twitching of left foot, which then involved right foot on stimulation  ROS:  Unable to obtain due to poor mental status  Examination  Vital signs in last 24 hours: Temp:  [92.7 F (33.7 C)-97.7 F (36.5 C)] 97.7 F (36.5 C) (04/22 1700) Pulse Rate:  [68-94] 93 (04/22 1700) Resp:  [13-18] 13 (04/22 1700) BP: (95-161)/(52-65) 161/63 (04/22 1555) SpO2:  [57 %-100 %] 57 % (04/22 1700) Arterial Line BP: (103-161)/(47-65) 128/50 (04/22 1700) FiO2 (%):  [40 %] 40 % (04/22 1555) Weight:  [86.7 kg (191 lb 2.2 oz)] 86.7 kg (191 lb 2.2 oz) (04/22 0354)  General: Not in distress, cooperative CVS: pulse-normal rate and rhythm RS: breathing comfortably Extremities: normal   Neuro: Mental Status: Patient does not respond to verbal stimuli.  Does not respond to deep sternal rub.  Does not follow commands.  No verbalizations noted.  Cranial Nerves: II: patient does not respond confrontation bilaterally, pupils right 2mm right 3mm left, not reactive/very sluggish III,IV,VI: doll's response absent bilaterally.  V,VII: corneal reflex: absent bilaterally VIII: patient does not respond to verbal stimuli IX,X: gag reflex absent  XII: tongue strength unable to test Motor: Extremities flaccid throughout.  No spontaneous movement noted.  No purposeful movements noted.  Sensory: Does not respond to noxious stimuli in any extremity. Deep Tendon Reflexes:  Absent throughout. Plantars: mute Cerebellar: Unable to perform  Basic Metabolic Panel: Recent Labs  Lab 02/17/2018 0659 02/13/2018 1116 02/20/18 0113 Jun 08, 2018 0345  NA 135  --  143 136  K 4.6  --  4.5 2.8*  CL 102  --  108 101  CO2 15*  --  21* 24  GLUCOSE 278*  --  74 201*  BUN 28*  --  30* 23*  CREATININE 2.07* 1.81* 2.12* 1.88*  CALCIUM 8.4*  --  7.2* 7.0*  MG  --   --  1.7  --   PHOS  --   --  3.8  --     CBC: Recent Labs   Lab 02/11/2018 0659 02/07/2018 1116 02/20/18 0113  WBC 8.4 18.0* 20.1*  NEUTROABS 6.4  --   --   HGB 9.7* 10.2* 9.3*  HCT 31.0* 32.2* 28.7*  MCV 92.8 91.5 91.4  PLT 284 315 253     Coagulation Studies: No results for input(s): LABPROT, INR in the last 72 hours.  Imaging Reviewed:     ASSESSMENT AND PLAN  Francisco Meyer is a 77 y.o. male who suffered 2 separate cardiac arrest.CPR was done for 15 minutes, after 2 doses of epinephrine they had return of pulses.  He subsequently around 1pm had a second code and again had return of spontaneous circulation. Noted to have myoclonic activity short after resuscitation.    Anoxic injury  Status Myoclonus  -  Status myolconus in first 24 hrs indicates very low chance for meaningful recovery -  Exam at 48 hrs remains poor off sedation - Initial EEG in sedation ( propofol) showed pattern seen in  myoclonic status epilepticus - MRI brain negative for anoxic injury,does shows prior encephalomalacia- MRI can be normal in patients severe anoxic injury.    Georgiana SpinnerSushanth Aroor [facility] Pager Number [phone] For questions after 7pm please refer to AMION to reach the Neurologist on call

## 2018-03-02 NOTE — Progress Notes (Signed)
Hypoglycemic Event  CBG: 54  Treatment: D50 given (at 1155) per Hypoglycemic Protocol Orders  Symptoms: pt intubated/sedated/not reactive - no symptoms assessed   Follow-up CBG: Time:1210 CBG Result:144  Possible Reasons for Event: pt NPO   Comments/MD notified: MD notified   Margot AblesWhitney  Gillon, RN

## 2018-03-02 NOTE — Progress Notes (Signed)
Pts time of death 2208, verified by second RN Nechama GuardJennifer Motley. Family at bedside, given emotional support. CCM MD notified. CDS notified- medical examiner notified, see postmortem flowsheet.

## 2018-03-02 DEATH — deceased

## 2018-10-05 IMAGING — CT CT HEAD W/O CM
4 series · 15 of 47 positions shown, 17 images · non-contrast
Comparison: Head CT 02/19/2018

CLINICAL DATA: Hypoxic ischemic encephalopathy

EXAM:
CT HEAD WITHOUT CONTRAST
TECHNIQUE: Contiguous axial images were obtained from the base of the skull
through the vertex without intravenous contrast.

[Series 3: head wo · axial · 0.43mm/px · z∈[-130,-10]mm · 7 of 33 slices shown, 9 images]
[im 5/33  brain]
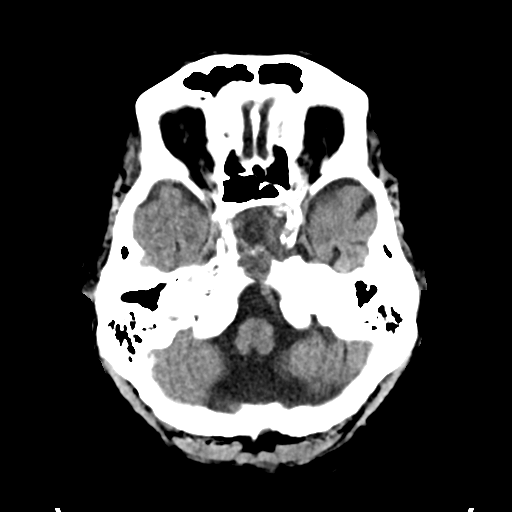
[im 5/33  bone]
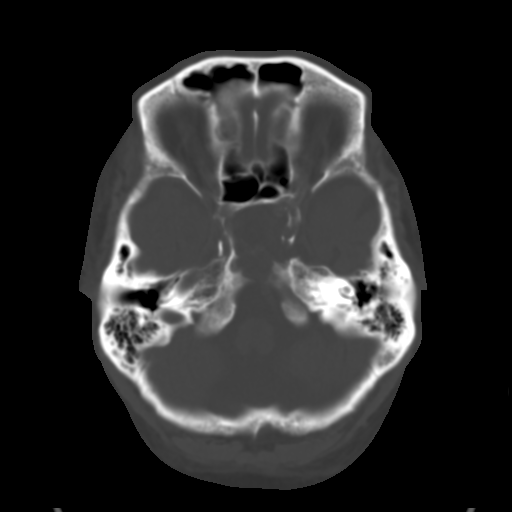
[im 9/33  brain]
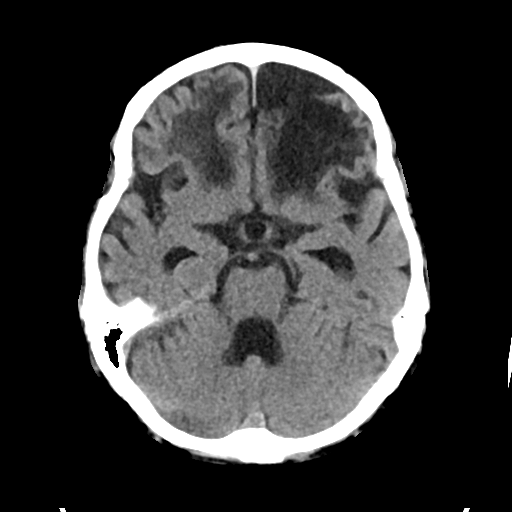
[im 13/33  brain]
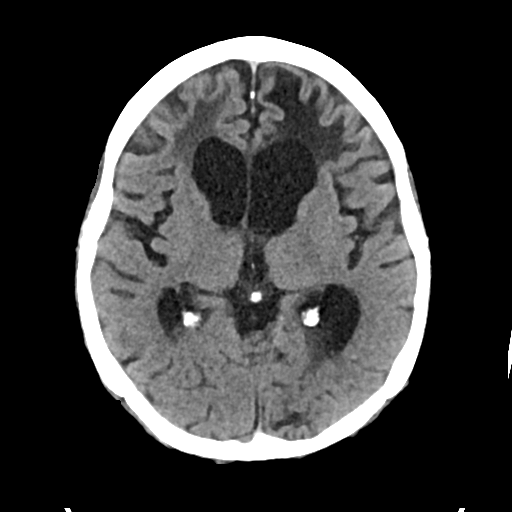
[im 17/33  brain]
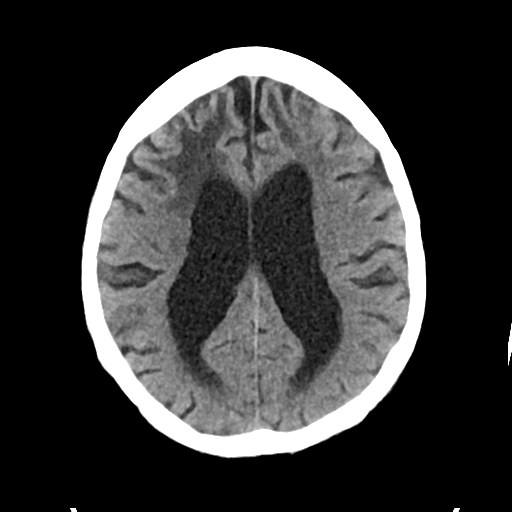
[im 21/33  brain]
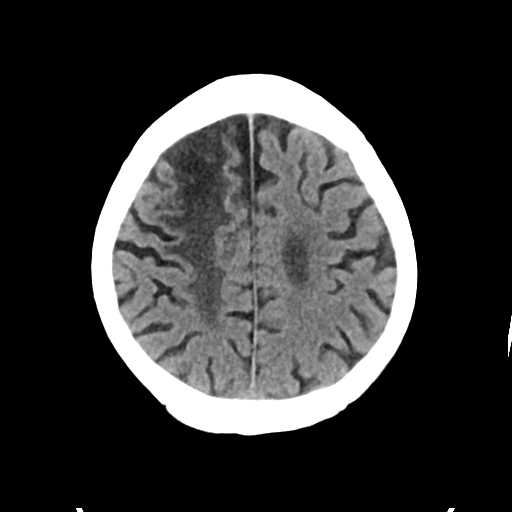
[im 21/33  bone]
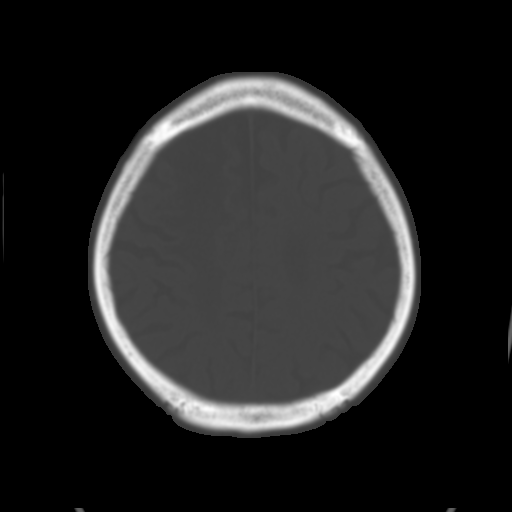
[im 25/33  brain]
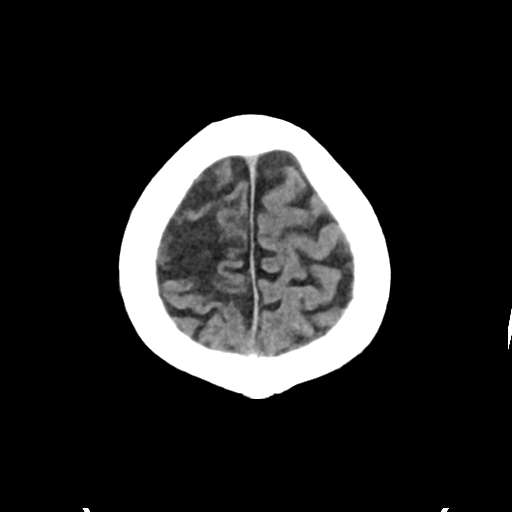
[im 29/33  brain]
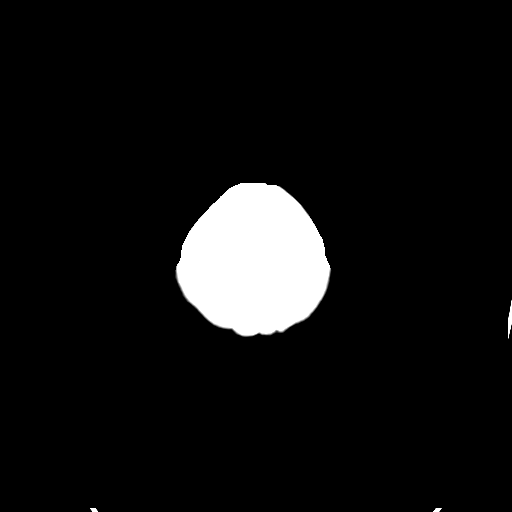

[Series 4: head bone · axial · 0.43mm/px · z∈[-134,-118]mm · 2 of 82 slices shown]
[im 9/82  bone]
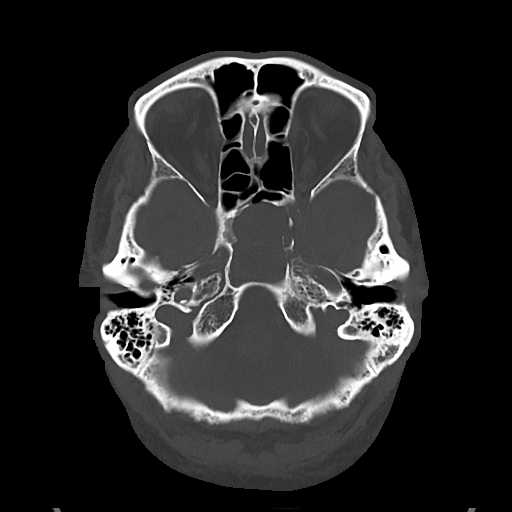
[im 17/82  bone]
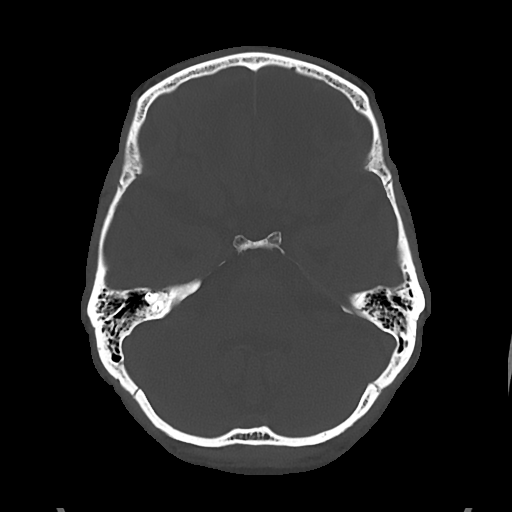

[Series 5: cor soft · coronal · 0.31mm/px · 3 of 61 slices shown]
[im 21/61  brain]
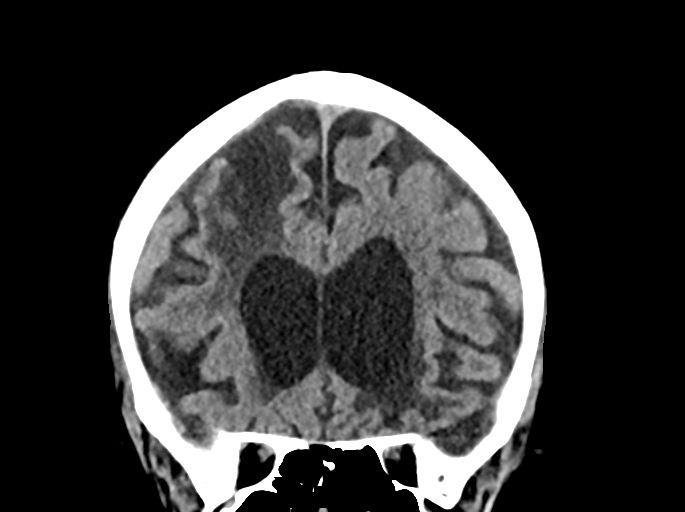
[im 27/61  brain]
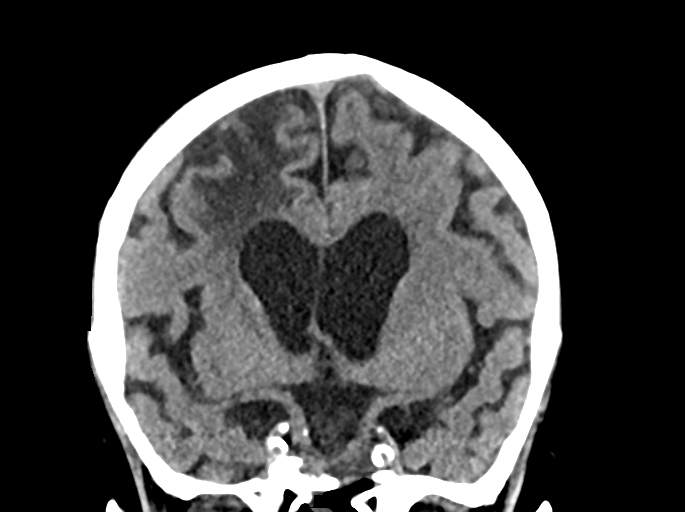
[im 34/61  brain]
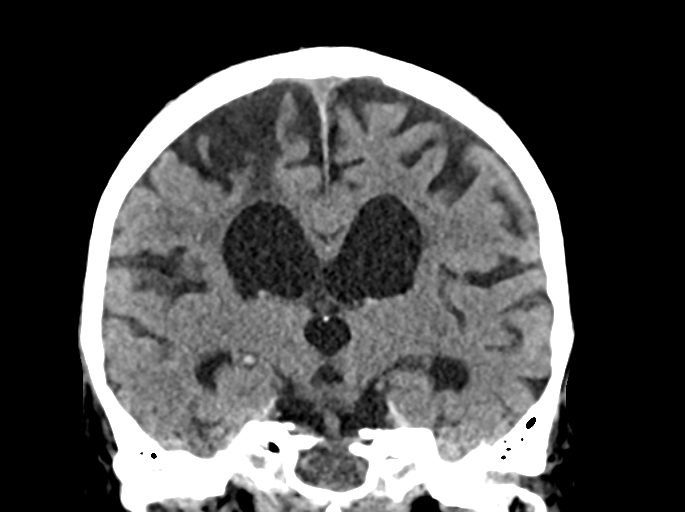

[Series 6: sag soft · sagittal · 0.32mm/px · 3 of 52 slices shown]
[im 18/52  brain]
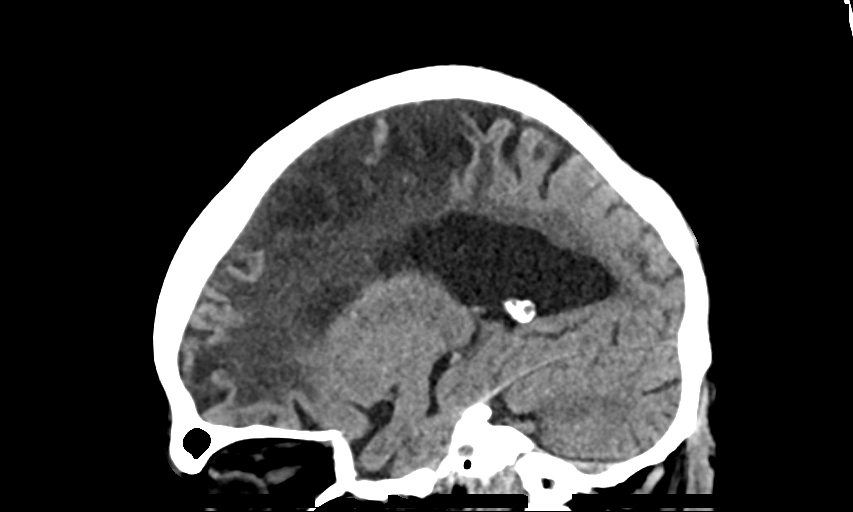
[im 26/52  brain]
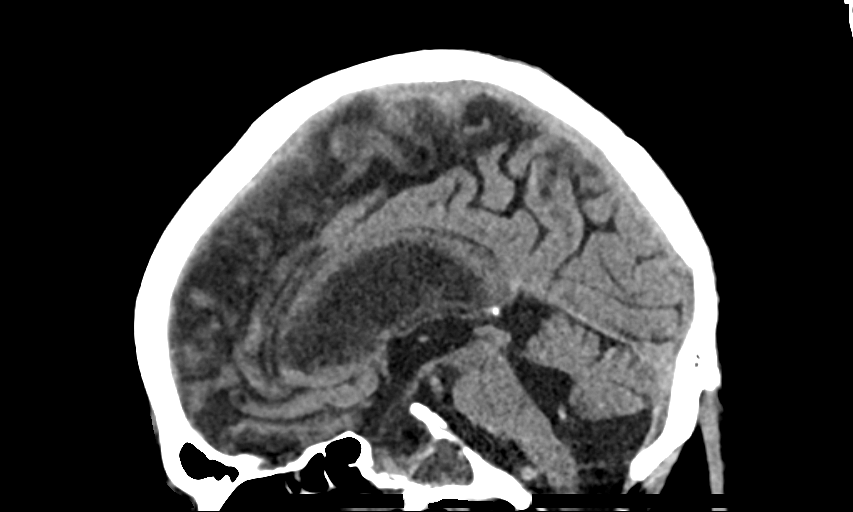
[im 35/52  brain]
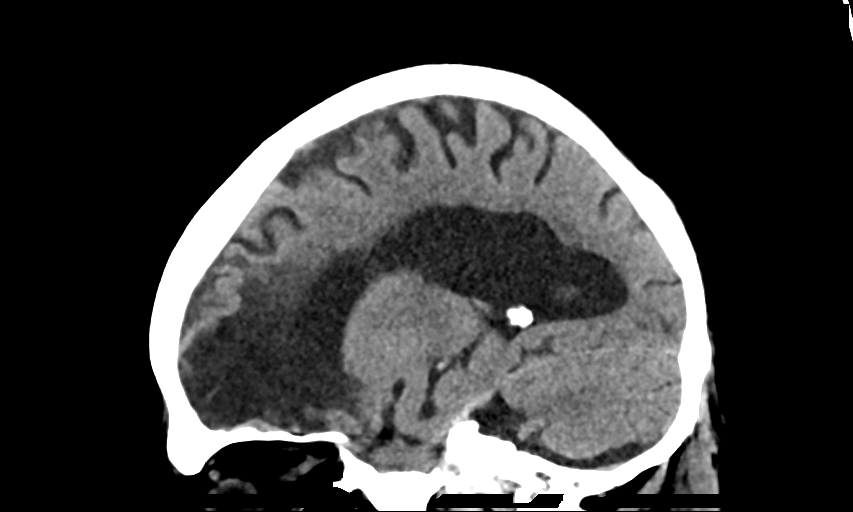

[15 of 47 positions shown; findings below may reference images not displayed]

FINDINGS: Brain: No mass lesion, intraparenchymal hemorrhage or extra-axial
collection. No evidence of acute cortical infarct. Unchanged
extensive bifrontal encephalomalacia with ex vacuo dilatation of the
lateral ventricles.

Vascular: No hyperdense vessel or unexpected vascular calcification.

Skull: Normal visualized skull base, calvarium and extracranial soft
tissues.

Sinuses/Orbits: No sinus fluid levels or advanced mucosal
thickening. No mastoid effusion. Normal orbits.
IMPRESSION: Unchanged bifrontal encephalomalacia without acute intracranial
abnormality.
